# Patient Record
Sex: Male | Born: 1940 | Race: White | Hispanic: No | Marital: Married
Health system: Midwestern US, Community
[De-identification: ages and names within clinical notes are randomized; demographics above are authoritative.]

## PROBLEM LIST (undated history)

## (undated) DIAGNOSIS — M109 Gout, unspecified: Secondary | ICD-10-CM

## (undated) DIAGNOSIS — I495 Sick sinus syndrome: Secondary | ICD-10-CM

## (undated) DIAGNOSIS — I712 Thoracic aortic aneurysm, without rupture, unspecified: Secondary | ICD-10-CM

## (undated) DIAGNOSIS — M349 Systemic sclerosis, unspecified: Secondary | ICD-10-CM

## (undated) DIAGNOSIS — I959 Hypotension, unspecified: Secondary | ICD-10-CM

## (undated) DIAGNOSIS — I639 Cerebral infarction, unspecified: Secondary | ICD-10-CM

## (undated) DIAGNOSIS — M341 CR(E)ST syndrome: Secondary | ICD-10-CM

## (undated) DIAGNOSIS — N401 Enlarged prostate with lower urinary tract symptoms: Secondary | ICD-10-CM

## (undated) DIAGNOSIS — Z87442 Personal history of urinary calculi: Secondary | ICD-10-CM

## (undated) DIAGNOSIS — R809 Proteinuria, unspecified: Secondary | ICD-10-CM

## (undated) DIAGNOSIS — R350 Frequency of micturition: Secondary | ICD-10-CM

## (undated) DIAGNOSIS — N200 Calculus of kidney: Secondary | ICD-10-CM

## (undated) DIAGNOSIS — R1310 Dysphagia, unspecified: Secondary | ICD-10-CM

## (undated) DIAGNOSIS — Z950 Presence of cardiac pacemaker: Secondary | ICD-10-CM

## (undated) DIAGNOSIS — Z9981 Dependence on supplemental oxygen: Secondary | ICD-10-CM

## (undated) DIAGNOSIS — G4730 Sleep apnea, unspecified: Secondary | ICD-10-CM

## (undated) DIAGNOSIS — I700 Atherosclerosis of aorta: Secondary | ICD-10-CM

## (undated) DIAGNOSIS — M47812 Spondylosis without myelopathy or radiculopathy, cervical region: Secondary | ICD-10-CM

## (undated) DIAGNOSIS — E538 Deficiency of other specified B group vitamins: Secondary | ICD-10-CM

## (undated) DIAGNOSIS — B351 Tinea unguium: Secondary | ICD-10-CM

## (undated) DIAGNOSIS — I872 Venous insufficiency (chronic) (peripheral): Secondary | ICD-10-CM

## (undated) DIAGNOSIS — G5600 Carpal tunnel syndrome, unspecified upper limb: Secondary | ICD-10-CM

## (undated) DIAGNOSIS — J449 Chronic obstructive pulmonary disease, unspecified: Secondary | ICD-10-CM

## (undated) DIAGNOSIS — R609 Edema, unspecified: Secondary | ICD-10-CM

## (undated) DIAGNOSIS — N138 Other obstructive and reflux uropathy: Secondary | ICD-10-CM

## (undated) DIAGNOSIS — G629 Polyneuropathy, unspecified: Secondary | ICD-10-CM

## (undated) DIAGNOSIS — Z7901 Long term (current) use of anticoagulants: Secondary | ICD-10-CM

## (undated) DIAGNOSIS — R413 Other amnesia: Secondary | ICD-10-CM

## (undated) DIAGNOSIS — I951 Orthostatic hypotension: Secondary | ICD-10-CM

## (undated) DIAGNOSIS — R0600 Dyspnea, unspecified: Secondary | ICD-10-CM

## (undated) DIAGNOSIS — K5790 Diverticulosis of intestine, part unspecified, without perforation or abscess without bleeding: Secondary | ICD-10-CM

## (undated) DIAGNOSIS — I491 Atrial premature depolarization: Secondary | ICD-10-CM

## (undated) DIAGNOSIS — R7303 Prediabetes: Secondary | ICD-10-CM

## (undated) DIAGNOSIS — Q2112 Patent foramen ovale: Secondary | ICD-10-CM

## (undated) DIAGNOSIS — E785 Hyperlipidemia, unspecified: Secondary | ICD-10-CM

## (undated) DIAGNOSIS — I4510 Unspecified right bundle-branch block: Secondary | ICD-10-CM

## (undated) DIAGNOSIS — I83899 Varicose veins of unspecified lower extremities with other complications: Secondary | ICD-10-CM

## (undated) DIAGNOSIS — Z77090 Contact with and (suspected) exposure to asbestos: Secondary | ICD-10-CM

## (undated) DIAGNOSIS — L97909 Non-pressure chronic ulcer of unspecified part of unspecified lower leg with unspecified severity: Secondary | ICD-10-CM

## (undated) DIAGNOSIS — I2510 Atherosclerotic heart disease of native coronary artery without angina pectoris: Secondary | ICD-10-CM

## (undated) DIAGNOSIS — Q211 Atrial septal defect: Secondary | ICD-10-CM

## (undated) DIAGNOSIS — R972 Elevated prostate specific antigen [PSA]: Secondary | ICD-10-CM

## (undated) DIAGNOSIS — K219 Gastro-esophageal reflux disease without esophagitis: Secondary | ICD-10-CM

## (undated) DIAGNOSIS — I739 Peripheral vascular disease, unspecified: Secondary | ICD-10-CM

## (undated) DIAGNOSIS — R262 Difficulty in walking, not elsewhere classified: Secondary | ICD-10-CM

## (undated) HISTORY — PX: HEMORRHOID SURGERY: SHX153

## (undated) HISTORY — PX: ANKLE FUSION: SHX881

## (undated) HISTORY — PX: OTHER SURGICAL HISTORY: SHX169

## (undated) HISTORY — PX: INSERT / REPLACE / REMOVE PACEMAKER: SUR710

---

## 2014-09-01 DIAGNOSIS — I639 Cerebral infarction, unspecified: Secondary | ICD-10-CM

## 2014-09-01 HISTORY — DX: Cerebral infarction, unspecified: I63.9

## 2015-09-18 ENCOUNTER — Emergency Department
Admission: EM | Admit: 2015-09-18 | Discharge: 2015-09-19 | Disposition: A | Discharge status: Home / Self Care | Payer: BLUE CROSS/BLUE SHIELD | Attending: Emergency Medicine | Admitting: Emergency Medicine

## 2015-09-18 ENCOUNTER — Encounter: Payer: Self-pay | Admitting: Emergency Medicine

## 2015-09-18 DIAGNOSIS — Z7902 Long term (current) use of antithrombotics/antiplatelets: Secondary | ICD-10-CM | POA: Diagnosis not present

## 2015-09-18 DIAGNOSIS — H578 Other specified disorders of eye and adnexa: Secondary | ICD-10-CM | POA: Diagnosis present

## 2015-09-18 DIAGNOSIS — Z7982 Long term (current) use of aspirin: Secondary | ICD-10-CM | POA: Diagnosis not present

## 2015-09-18 DIAGNOSIS — Z79899 Other long term (current) drug therapy: Secondary | ICD-10-CM | POA: Diagnosis not present

## 2015-09-18 DIAGNOSIS — H5461 Unqualified visual loss, right eye, normal vision left eye: Secondary | ICD-10-CM | POA: Diagnosis not present

## 2015-09-18 DIAGNOSIS — H1131 Conjunctival hemorrhage, right eye: Secondary | ICD-10-CM | POA: Diagnosis not present

## 2015-09-18 HISTORY — DX: Chronic obstructive pulmonary disease, unspecified: J44.9

## 2015-09-18 HISTORY — DX: Systemic sclerosis, unspecified: M34.9

## 2015-09-18 NOTE — ED Notes (Signed)
Pt reports he woke up this morning and noticed redness/stickyness of the right eye.  Pt c/o of pain/irritation rated at a 1 out of 10. Pt denies drainage.  Pt's right eye is deep/bright red over entire sclera.

## 2015-09-18 NOTE — ED Notes (Signed)
Pt presents to ED with redness to his right eye. Pt states he woke up with the redness and denies any known injury. Denies pain and states he feels pressure behind the affected eye. Hx of the same but has never seen an MD for evaluation.

## 2015-09-18 NOTE — ED Notes (Signed)
Pt reports he had a temporary loss of vision (5 minutes) on Tuesday or Wednesday of this week. Pt states: "there was a line directly down the center of my vision that was gone". Pt c/o pain/stickyness of right eye beginning this morning rated at a 1 out of 10. 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

## 2015-09-19 ENCOUNTER — Emergency Department: Payer: BLUE CROSS/BLUE SHIELD

## 2015-09-19 LAB — CBC WITH DIFFERENTIAL/PLATELET
BASOS ABS: 0.1 10*3/uL (ref 0–0.1)
BASOS PCT: 1 %
EOS ABS: 0.2 10*3/uL (ref 0–0.7)
EOS PCT: 2 %
HCT: 37.9 % — ABNORMAL LOW (ref 40.0–52.0)
HEMOGLOBIN: 13.2 g/dL (ref 13.0–18.0)
LYMPHS ABS: 1.2 10*3/uL (ref 1.0–3.6)
Lymphocytes Relative: 16 %
MCH: 30 pg (ref 26.0–34.0)
MCHC: 34.8 g/dL (ref 32.0–36.0)
MCV: 86.2 fL (ref 80.0–100.0)
Monocytes Absolute: 0.6 10*3/uL (ref 0.2–1.0)
Monocytes Relative: 7 %
NEUTROS PCT: 74 %
Neutro Abs: 5.8 10*3/uL (ref 1.4–6.5)
PLATELETS: 231 10*3/uL (ref 150–440)
RBC: 4.4 MIL/uL (ref 4.40–5.90)
RDW: 14.7 % — ABNORMAL HIGH (ref 11.5–14.5)
WBC: 7.8 10*3/uL (ref 3.8–10.6)

## 2015-09-19 LAB — COMPREHENSIVE METABOLIC PANEL
ALBUMIN: 3.3 g/dL — AB (ref 3.5–5.0)
ALK PHOS: 70 U/L (ref 38–126)
ALT: 21 U/L (ref 17–63)
AST: 29 U/L (ref 15–41)
Anion gap: 6 (ref 5–15)
BUN: 16 mg/dL (ref 6–20)
CALCIUM: 8.7 mg/dL — AB (ref 8.9–10.3)
CHLORIDE: 110 mmol/L (ref 101–111)
CO2: 23 mmol/L (ref 22–32)
CREATININE: 0.73 mg/dL (ref 0.61–1.24)
GFR calc Af Amer: >60 mL/min (ref >60)
GFR calc non Af Amer: >60 mL/min (ref >60)
GLUCOSE: 100 mg/dL — AB (ref 65–99)
Potassium: 3.8 mmol/L (ref 3.5–5.1)
SODIUM: 139 mmol/L (ref 135–145)
Total Bilirubin: 0.6 mg/dL (ref 0.3–1.2)
Total Protein: 6.3 g/dL — ABNORMAL LOW (ref 6.5–8.1)

## 2015-09-19 LAB — TROPONIN I

## 2015-09-19 MED ORDER — IOHEXOL 350 MG/ML SOLN
80.0000 mL | Freq: Once | INTRAVENOUS | Status: AC | PRN
  Administered 2015-09-19: 80 mL via INTRAVENOUS

## 2015-09-19 NOTE — ED Provider Notes (Signed)
[facility] Emergency Department Provider Note  ____________________________________________  Time seen: Approximately 0017 AM  I have reviewed the triage vital signs and the nursing notes.   HISTORY  Chief Complaint Eye Problem    HPI ISAIH GENTER is a 75 y.o. male who comes into the hospital today with an eye problem. The patient reports that his eye turned red and he has blood in his right eye. He reports he noticed when he woke up this morning. The patient is driving up from [place] and his wife was concerned and wanted him to get it checked. She said she does not like the look of it and last time this occurred was about a year ago the patient had a stroke. She reports that at that time the patient had some drooping of his face but he did have this redness of his eye as well. She reports that the eye cleared up before the symptoms started. The patient also reports that he did have 2-3 days ago a line of vision loss that resolved in a few minutes. The patient has been in [place] helping his brother with some medical challenges. The patient does had no facial droop or slurred speech. He's had no weakness as well. The patient is driving to [place] which is where he lives. The patient's wife was very concerned that he may have another stroke and wanted him checked out sooner rather than later. The patient reports at this time he does not have any blurred vision. He reports that his right eye is uncomfortable but it is not painful. He rates his discomfort a 1 out of 10 in intensity.   Past Medical History  Diagnosis Date  . 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 COPD, moderate ([place])     There are no active problems to display for this patient.   No past surgical history on file.  Current Outpatient Rx  Name  Route  Sig  Dispense  Refill  . aspirin 81 MG tablet   Oral   Take by mouth daily.         Marland Kitchen atorvastatin (LIPITOR) 80 MG tablet   Oral   Take 80 mg by  mouth daily.         . clopidogrel (PLAVIX) 75 MG tablet   Oral   Take 75 mg by mouth daily.         . clotrimazole-betamethasone (LOTRISONE) cream   Topical   Apply 1 application topically 2 (two) times daily.         . finasteride (PROSCAR) 5 MG tablet   Oral   Take 5 mg by mouth daily.         . hydroxypropyl methylcellulose / hypromellose (ISOPTO TEARS / GONIOVISC) 2.5 % ophthalmic solution      1 drop.         . lactobacillus acidophilus (BACID) TABS tablet   Oral   Take 2 tablets by mouth 3 (three) times daily.         . meclizine (ANTIVERT) 25 MG tablet   Oral   Take 25 mg by mouth 3 (three) times daily as needed for dizziness.         . midodrine (PROAMATINE) 5 MG tablet   Oral   Take 5 mg by mouth 3 (three) times daily with meals.         . pantoprazole (PROTONIX) 40 MG tablet   Oral   Take 40 mg by mouth daily.         Marland Kitchen  potassium citrate (UROCIT-K) 10 MEQ (1080 MG) SR tablet   Oral   Take 10 mEq by mouth 3 (three) times daily with meals.         . vitamin B-12 (CYANOCOBALAMIN) 500 MCG tablet   Oral   Take 500 mcg by mouth daily.           Allergies Cellcept; Vioxx; and Ciprofloxacin  No family history on file.  Social History Social History  Substance Use Topics  . Smoking status: Never Smoker   . Smokeless tobacco: None  . Alcohol Use: No    Review of Systems Constitutional: No fever/chills Eyes: Eye redness, temporary visual field loss ENT: No sore throat. Cardiovascular: Denies chest pain. Respiratory: Denies shortness of breath. Gastrointestinal: No abdominal pain.  No nausea, no vomiting.  No diarrhea.  No constipation. Genitourinary: Negative for dysuria. Musculoskeletal: Negative for back pain. Skin: Negative for rash. Neurological: Negative for headaches, focal weakness or numbness.  10-point ROS otherwise negative.  ____________________________________________   PHYSICAL EXAM:  VITAL SIGNS: ED  Triage Vitals  Enc Vitals Group     BP 09/18/15 2023 131/73 mmHg     Pulse Rate 09/18/15 2023 81     Resp 09/18/15 2023 18     Temp 09/18/15 2023 97.8 F (36.6 C)     Temp Source 09/18/15 2023 Oral     SpO2 09/18/15 2023 99 %     Weight 09/18/15 2023 200 lb (90.719 kg)     Height 09/18/15 2023 5\' 8"  (1.727 m)     Head Cir --      Peak Flow --      Pain Score 09/18/15 2025 2     Pain Loc --      Pain Edu? --      Excl. in [place]? --     Constitutional: Alert and oriented. Well appearing and in no acute distress. Eyes: Supple conjunctival hemorrhage noted to right thigh, funduscopic exam shows no AV nicking no hemorrhage to retina no papilledema no pale retina Head: Atraumatic. Nose: No congestion/rhinnorhea. Mouth/Throat: Mucous membranes are moist.  Oropharynx non-erythematous. Cardiovascular: Normal rate, regular rhythm. Grossly normal heart sounds.  Good peripheral circulation. Respiratory: Normal respiratory effort.  No retractions. Lungs CTAB. Gastrointestinal: Soft and nontender. No distention. Positive Bowel sounds Musculoskeletal: No lower extremity tenderness nor edema.   Neurologic:  Normal speech and language. Cranial nerves II through XII are grossly intact with no focal motor or neuro deficits Skin:  Skin is warm, dry and intact.  Psychiatric: Mood and affect are normal.   ____________________________________________   LABS (all labs ordered are listed, but only abnormal results are displayed)  Labs Reviewed  CBC WITH DIFFERENTIAL/PLATELET - Abnormal; Notable for the following:    HCT 37.9 (*)    RDW 14.7 (*)    All other components within normal limits  COMPREHENSIVE METABOLIC PANEL - Abnormal; Notable for the following:    Glucose, Bld 100 (*)    Calcium 8.7 (*)    Total Protein 6.3 (*)    Albumin 3.3 (*)    All other components within normal limits  TROPONIN I   ____________________________________________  EKG  ED ECG REPORT I, Loney Hering,  the attending physician, personally viewed and interpreted this ECG.   Date: 09/18/2015  EKG Time: 2344  Rate: 62  Rhythm: RBBB, atrial paced rhythm  Axis: normal  Intervals:right bundle branch block  ST&T Change: none  ____________________________________________  RADIOLOGY  CT angiogram head: No acute  intracranial process, normal CTA of the head ____________________________________________   PROCEDURES  Procedure(s) performed: None  Critical Care performed: No  ____________________________________________   INITIAL IMPRESSION / ASSESSMENT AND PLAN / ED COURSE  Pertinent labs & imaging results that were available during my care of the patient were reviewed by me and considered in my medical decision making (see chart for details).  This is a 75 year old male who comes into the hospital today with some right supple conjunctival hemorrhage and a concern for stroke. The patient had similar symptoms with a stroke previously. We did check some blood work as well as an EKG we also checked a CT scan which was unremarkable. At this time I feel that the patient is stable to be discharged home. His visual acuity is unremarkable and he is in no acute distress. He will be discharged to follow-up with ophthalmology as well as his primary care physician. Wife understand the plans as stated. ____________________________________________   FINAL CLINICAL IMPRESSION(S) / ED DIAGNOSES  Final diagnoses:  Vision loss of right eye  Subconjunctival hemorrhage, right      Loney Hering, MD 09/19/15 [phone]

## 2015-09-19 NOTE — Discharge Instructions (Signed)
Subconjunctival Hemorrhage Subconjunctival hemorrhage is bleeding that happens between the white part of your eye (sclera) and the clear membrane that covers the outside of your eye (conjunctiva). There are many tiny blood vessels near the surface of your eye. A subconjunctival hemorrhage happens when one or more of these vessels breaks and bleeds, causing a red patch to appear on your eye. This is similar to a bruise. Depending on the amount of bleeding, the red patch may only cover a small area of your eye or it may cover the entire visible part of the sclera. If a lot of blood collects under the conjunctiva, there may also be swelling. Subconjunctival hemorrhages do not affect your vision or cause pain, but your eye may feel irritated if there is swelling. Subconjunctival hemorrhages usually do not require treatment, and they disappear on their own within two weeks. CAUSES This condition may be caused by:  Mild trauma, such as rubbing your eye too hard.  Severe trauma or blunt injuries.  Coughing, sneezing, or vomiting.  Straining, such as when lifting a heavy object.  High blood pressure.  Recent eye surgery.  A history of diabetes.  Certain medicines, especially blood thinners (anticoagulants).  Other conditions, such as eye tumors, bleeding disorders, or blood vessel abnormalities. Subconjunctival hemorrhages can happen without an obvious cause.  SYMPTOMS  Symptoms of this condition include:  A bright red or dark red patch on the white part of the eye.  The red area may spread out to cover a larger area of the eye before it goes away.  The red area may turn brownish-yellow before it goes away.  Swelling.  Mild eye irritation. DIAGNOSIS This condition is diagnosed with a physical exam. If your subconjunctival hemorrhage was caused by trauma, your health care provider may refer you to an eye specialist (ophthalmologist) or another specialist to check for other injuries. You  may have other tests, including:  An eye exam.  A blood pressure check.  Blood tests to check for bleeding disorders. If your subconjunctival hemorrhage was caused by trauma, X-rays or a CT scan may be done to check for other injuries. TREATMENT Usually, no treatment is needed. Your health care provider may recommend eye drops or cold compresses to help with discomfort. HOME CARE INSTRUCTIONS  Take over-the-counter and prescription medicines only as directed by your health care provider.  Use eye drops or cold compresses to help with discomfort as directed by your health care provider.  Avoid activities, things, and environments that may irritate or injure your eye.  Keep all follow-up visits as told by your health care provider. This is important. SEEK MEDICAL CARE IF:  You have pain in your eye.  The bleeding does not go away within 3 weeks.  You keep getting new subconjunctival hemorrhages. SEEK IMMEDIATE MEDICAL CARE IF:  Your vision changes or you have difficulty seeing.  You suddenly develop severe sensitivity to light.  You develop a severe headache, persistent vomiting, confusion, or abnormal tiredness (lethargy).  Your eye seems to bulge or protrude from your eye socket.  You develop unexplained bruises on your body.  You have unexplained bleeding in another area of your body.   This information is not intended to replace advice given to you by your health care provider. Make sure you discuss any questions you have with your health care provider.   Document Released: 06/19/2005 Document Revised: 03/10/2015 Document Reviewed: 08/26/2014 Elsevier Interactive Patient Education 2016 [employer].  Visual Disturbances You have had  a disturbance in your vision. This may be caused by various conditions, such as:  Migraines. Migraine headaches are often preceded by a disturbance in vision. Blind spots or light flashes are followed by a headache. This type of visual  disturbance is temporary. It does not damage the eye.  Glaucoma. This is caused by increased pressure in the eye. Symptoms include haziness, blurred vision, or seeing rainbow colored circles when looking at bright lights. Partial or complete visual loss can occur. You may or may not experience eye pain. Visual loss may be gradual or sudden and is irreversible. Glaucoma is the leading cause of blindness.  Retina problems. Vision will be reduced if the retina becomes detached or if there is a circulation problem as with diabetes, high blood pressure, or a mini-stroke. Symptoms include seeing "floaters," flashes of light, or shadows, as if a curtain has fallen over your eye.  Optic nerve problems. The main nerve in your eye can be damaged by redness, soreness, and swelling (inflammation), poor circulation, drugs, and toxins. It is very important to have a complete exam done by a specialist to determine the exact cause of your eye problem. The specialist may recommend medicines or surgery, depending on the cause of the problem. This can help prevent further loss of vision or reduce the risk of having a stroke. Contact the caregiver to whom you have been referred and arrange for follow-up care right away. SEEK IMMEDIATE MEDICAL CARE IF:   Your vision gets worse.  You develop severe headaches.  You have any weakness or numbness in the face, arms, or legs.  You have any trouble speaking or walking.   This information is not intended to replace advice given to you by your health care provider. Make sure you discuss any questions you have with your health care provider.   Document Released: 07/27/2004 Document Revised: 09/11/2011 Document Reviewed: 11/26/2013 Elsevier Interactive Patient Education [employer].

## 2015-09-19 NOTE — ED Notes (Signed)
Reviewed d/c instructions, and follow-up care with pt. Pt verbalized understanding 

## 2016-07-12 DIAGNOSIS — K219 Gastro-esophageal reflux disease without esophagitis: Secondary | ICD-10-CM | POA: Insufficient documentation

## 2016-07-12 DIAGNOSIS — N401 Enlarged prostate with lower urinary tract symptoms: Secondary | ICD-10-CM | POA: Insufficient documentation

## 2016-07-12 DIAGNOSIS — Z8673 Personal history of transient ischemic attack (TIA), and cerebral infarction without residual deficits: Secondary | ICD-10-CM | POA: Insufficient documentation

## 2016-07-12 DIAGNOSIS — J449 Chronic obstructive pulmonary disease, unspecified: Secondary | ICD-10-CM | POA: Insufficient documentation

## 2016-07-12 DIAGNOSIS — Z950 Presence of cardiac pacemaker: Secondary | ICD-10-CM | POA: Insufficient documentation

## 2016-07-26 DIAGNOSIS — I7300 Raynaud's syndrome without gangrene: Secondary | ICD-10-CM | POA: Insufficient documentation

## 2016-07-28 ENCOUNTER — Other Ambulatory Visit: Payer: Self-pay | Admitting: Internal Medicine

## 2016-07-28 DIAGNOSIS — J849 Interstitial pulmonary disease, unspecified: Secondary | ICD-10-CM

## 2016-07-28 DIAGNOSIS — M349 Systemic sclerosis, unspecified: Secondary | ICD-10-CM

## 2016-08-02 DIAGNOSIS — I6523 Occlusion and stenosis of bilateral carotid arteries: Secondary | ICD-10-CM | POA: Insufficient documentation

## 2016-08-08 ENCOUNTER — Other Ambulatory Visit: Payer: Self-pay | Admitting: Internal Medicine

## 2016-08-08 DIAGNOSIS — J849 Interstitial pulmonary disease, unspecified: Secondary | ICD-10-CM

## 2016-08-08 DIAGNOSIS — M349 Systemic sclerosis, unspecified: Secondary | ICD-10-CM

## 2016-08-09 ENCOUNTER — Ambulatory Visit
Admission: RE | Admit: 2016-08-09 | Discharge: 2016-08-09 | Disposition: A | Discharge status: Home / Self Care | Payer: Medicare HMO | Source: Ambulatory Visit | Attending: Internal Medicine | Admitting: Internal Medicine

## 2016-08-09 DIAGNOSIS — K228 Other specified diseases of esophagus: Secondary | ICD-10-CM | POA: Diagnosis not present

## 2016-08-09 DIAGNOSIS — R911 Solitary pulmonary nodule: Secondary | ICD-10-CM | POA: Diagnosis not present

## 2016-08-09 DIAGNOSIS — M349 Systemic sclerosis, unspecified: Secondary | ICD-10-CM | POA: Diagnosis not present

## 2016-08-09 DIAGNOSIS — J849 Interstitial pulmonary disease, unspecified: Secondary | ICD-10-CM | POA: Diagnosis not present

## 2016-08-09 DIAGNOSIS — I700 Atherosclerosis of aorta: Secondary | ICD-10-CM | POA: Diagnosis not present

## 2016-08-09 DIAGNOSIS — I712 Thoracic aortic aneurysm, without rupture: Secondary | ICD-10-CM | POA: Diagnosis not present

## 2016-08-10 ENCOUNTER — Ambulatory Visit: Payer: Medicare Other

## 2016-08-14 DIAGNOSIS — I7121 Aneurysm of the ascending aorta, without rupture: Secondary | ICD-10-CM | POA: Insufficient documentation

## 2016-08-14 DIAGNOSIS — I719 Aortic aneurysm of unspecified site, without rupture: Secondary | ICD-10-CM | POA: Insufficient documentation

## 2016-08-16 DIAGNOSIS — M341 CR(E)ST syndrome: Secondary | ICD-10-CM | POA: Insufficient documentation

## 2016-08-25 ENCOUNTER — Other Ambulatory Visit: Payer: Self-pay | Admitting: Gastroenterology

## 2016-08-25 DIAGNOSIS — R1310 Dysphagia, unspecified: Secondary | ICD-10-CM

## 2016-08-25 DIAGNOSIS — K219 Gastro-esophageal reflux disease without esophagitis: Secondary | ICD-10-CM

## 2016-08-30 ENCOUNTER — Ambulatory Visit: Payer: Medicare HMO | Attending: Specialist

## 2016-08-30 DIAGNOSIS — G4733 Obstructive sleep apnea (adult) (pediatric): Secondary | ICD-10-CM | POA: Insufficient documentation

## 2016-08-30 DIAGNOSIS — G4761 Periodic limb movement disorder: Secondary | ICD-10-CM | POA: Diagnosis not present

## 2016-08-31 ENCOUNTER — Ambulatory Visit
Admission: RE | Admit: 2016-08-31 | Discharge: 2016-08-31 | Disposition: A | Discharge status: Home / Self Care | Payer: Medicare HMO | Source: Ambulatory Visit | Attending: Gastroenterology | Admitting: Gastroenterology

## 2016-08-31 DIAGNOSIS — R1310 Dysphagia, unspecified: Secondary | ICD-10-CM | POA: Diagnosis present

## 2016-08-31 DIAGNOSIS — K219 Gastro-esophageal reflux disease without esophagitis: Secondary | ICD-10-CM | POA: Insufficient documentation

## 2016-09-19 ENCOUNTER — Ambulatory Visit: Payer: Medicare HMO | Attending: Specialist

## 2016-09-19 DIAGNOSIS — G4761 Periodic limb movement disorder: Secondary | ICD-10-CM | POA: Insufficient documentation

## 2016-09-19 DIAGNOSIS — G4733 Obstructive sleep apnea (adult) (pediatric): Secondary | ICD-10-CM | POA: Insufficient documentation

## 2016-11-15 ENCOUNTER — Other Ambulatory Visit: Payer: Self-pay | Admitting: Specialist

## 2016-11-15 DIAGNOSIS — R9389 Abnormal findings on diagnostic imaging of other specified body structures: Secondary | ICD-10-CM

## 2016-11-15 DIAGNOSIS — R0602 Shortness of breath: Secondary | ICD-10-CM

## 2016-12-29 ENCOUNTER — Ambulatory Visit: Payer: Medicare HMO | Admitting: Anesthesiology

## 2016-12-29 ENCOUNTER — Encounter
Admission: RE | Disposition: A | Discharge status: Home / Self Care | Payer: Self-pay | Source: Ambulatory Visit | Attending: Gastroenterology

## 2016-12-29 ENCOUNTER — Ambulatory Visit
Admission: RE | Admit: 2016-12-29 | Discharge: 2016-12-29 | Disposition: A | Discharge status: Home / Self Care | Payer: Medicare HMO | Source: Ambulatory Visit | Attending: Gastroenterology | Admitting: Gastroenterology

## 2016-12-29 ENCOUNTER — Encounter: Payer: Self-pay | Admitting: Anesthesiology

## 2016-12-29 DIAGNOSIS — Z8673 Personal history of transient ischemic attack (TIA), and cerebral infarction without residual deficits: Secondary | ICD-10-CM | POA: Insufficient documentation

## 2016-12-29 DIAGNOSIS — K5730 Diverticulosis of large intestine without perforation or abscess without bleeding: Secondary | ICD-10-CM | POA: Insufficient documentation

## 2016-12-29 DIAGNOSIS — G629 Polyneuropathy, unspecified: Secondary | ICD-10-CM | POA: Insufficient documentation

## 2016-12-29 DIAGNOSIS — Z86010 Personal history of colonic polyps: Secondary | ICD-10-CM | POA: Insufficient documentation

## 2016-12-29 DIAGNOSIS — Z7902 Long term (current) use of antithrombotics/antiplatelets: Secondary | ICD-10-CM | POA: Insufficient documentation

## 2016-12-29 DIAGNOSIS — K3189 Other diseases of stomach and duodenum: Secondary | ICD-10-CM | POA: Diagnosis not present

## 2016-12-29 DIAGNOSIS — R7303 Prediabetes: Secondary | ICD-10-CM | POA: Insufficient documentation

## 2016-12-29 DIAGNOSIS — M1990 Unspecified osteoarthritis, unspecified site: Secondary | ICD-10-CM | POA: Insufficient documentation

## 2016-12-29 DIAGNOSIS — E785 Hyperlipidemia, unspecified: Secondary | ICD-10-CM | POA: Insufficient documentation

## 2016-12-29 DIAGNOSIS — G4730 Sleep apnea, unspecified: Secondary | ICD-10-CM | POA: Diagnosis not present

## 2016-12-29 DIAGNOSIS — Z950 Presence of cardiac pacemaker: Secondary | ICD-10-CM | POA: Diagnosis not present

## 2016-12-29 DIAGNOSIS — K5520 Angiodysplasia of colon without hemorrhage: Secondary | ICD-10-CM | POA: Diagnosis not present

## 2016-12-29 DIAGNOSIS — K228 Other specified diseases of esophagus: Secondary | ICD-10-CM | POA: Insufficient documentation

## 2016-12-29 DIAGNOSIS — Z79899 Other long term (current) drug therapy: Secondary | ICD-10-CM | POA: Insufficient documentation

## 2016-12-29 DIAGNOSIS — R1310 Dysphagia, unspecified: Secondary | ICD-10-CM | POA: Insufficient documentation

## 2016-12-29 DIAGNOSIS — Z880 Allergy status to penicillin: Secondary | ICD-10-CM | POA: Diagnosis not present

## 2016-12-29 DIAGNOSIS — Z7982 Long term (current) use of aspirin: Secondary | ICD-10-CM | POA: Insufficient documentation

## 2016-12-29 HISTORY — DX: Presence of cardiac pacemaker: Z95.0

## 2016-12-29 HISTORY — DX: Tinea unguium: B35.1

## 2016-12-29 HISTORY — DX: Gout, unspecified: M10.9

## 2016-12-29 HISTORY — DX: Hyperlipidemia, unspecified: E78.5

## 2016-12-29 HISTORY — PX: COLONOSCOPY WITH PROPOFOL: SHX5780

## 2016-12-29 HISTORY — DX: Dyspnea, unspecified: R06.00

## 2016-12-29 HISTORY — DX: Proteinuria, unspecified: R80.9

## 2016-12-29 HISTORY — DX: Other amnesia: R41.3

## 2016-12-29 HISTORY — DX: Deficiency of other specified B group vitamins: E53.8

## 2016-12-29 HISTORY — DX: Elevated prostate specific antigen (PSA): R97.20

## 2016-12-29 HISTORY — DX: Gastro-esophageal reflux disease without esophagitis: K21.9

## 2016-12-29 HISTORY — PX: ESOPHAGOGASTRODUODENOSCOPY (EGD) WITH PROPOFOL: SHX5813

## 2016-12-29 HISTORY — DX: Difficulty in walking, not elsewhere classified: R26.2

## 2016-12-29 HISTORY — DX: Polyneuropathy, unspecified: G62.9

## 2016-12-29 HISTORY — DX: Sleep apnea, unspecified: G47.30

## 2016-12-29 HISTORY — DX: Benign prostatic hyperplasia with lower urinary tract symptoms: N40.1

## 2016-12-29 HISTORY — DX: Other obstructive and reflux uropathy: N13.8

## 2016-12-29 HISTORY — DX: Venous insufficiency (chronic) (peripheral): I87.2

## 2016-12-29 HISTORY — DX: Dysphagia, unspecified: R13.10

## 2016-12-29 HISTORY — DX: Varicose veins of unspecified lower extremity with other complications: I83.899

## 2016-12-29 HISTORY — DX: Diverticulosis of intestine, part unspecified, without perforation or abscess without bleeding: K57.90

## 2016-12-29 HISTORY — DX: Edema, unspecified: R60.9

## 2016-12-29 HISTORY — DX: Cerebral infarction, unspecified: I63.9

## 2016-12-29 HISTORY — DX: Hypotension, unspecified: I95.9

## 2016-12-29 HISTORY — DX: Atrial septal defect: Q21.1

## 2016-12-29 HISTORY — DX: Calculus of kidney: N20.0

## 2016-12-29 HISTORY — DX: Orthostatic hypotension: I95.1

## 2016-12-29 HISTORY — DX: Contact with and (suspected) exposure to asbestos: Z77.090

## 2016-12-29 HISTORY — DX: Prediabetes: R73.03

## 2016-12-29 HISTORY — DX: Non-pressure chronic ulcer of unspecified part of unspecified lower leg with unspecified severity: L97.909

## 2016-12-29 HISTORY — DX: Patent foramen ovale: Q21.12

## 2016-12-29 HISTORY — DX: Spondylosis without myelopathy or radiculopathy, cervical region: M47.812

## 2016-12-29 HISTORY — DX: Carpal tunnel syndrome, unspecified upper limb: G56.00

## 2016-12-29 HISTORY — DX: Systemic sclerosis, unspecified: M34.9

## 2016-12-29 HISTORY — DX: Frequency of micturition: R35.0

## 2016-12-29 SURGERY — COLONOSCOPY WITH PROPOFOL
Anesthesia: General

## 2016-12-29 MED ORDER — FENTANYL CITRATE (PF) 100 MCG/2ML IJ SOLN
INTRAMUSCULAR | Status: DC | PRN
  Administered 2016-12-29: 50 ug via INTRAVENOUS

## 2016-12-29 MED ORDER — GLYCOPYRROLATE 0.2 MG/ML IJ SOLN
INTRAMUSCULAR | Status: DC | PRN
  Administered 2016-12-29: 0.1 mg via INTRAVENOUS

## 2016-12-29 MED ORDER — PROPOFOL 500 MG/50ML IV EMUL
INTRAVENOUS | Status: DC | PRN
  Administered 2016-12-29: 120 ug/kg/min via INTRAVENOUS

## 2016-12-29 MED ORDER — FENTANYL CITRATE (PF) 100 MCG/2ML IJ SOLN
INTRAMUSCULAR | Status: AC
  Filled 2016-12-29: qty 2

## 2016-12-29 MED ORDER — MIDAZOLAM HCL 2 MG/2ML IJ SOLN
INTRAMUSCULAR | Status: DC | PRN
  Administered 2016-12-29: 2 mg via INTRAVENOUS

## 2016-12-29 MED ORDER — SODIUM CHLORIDE 0.9 % IV SOLN
INTRAVENOUS | Status: DC
  Administered 2016-12-29: 11:00:00 via INTRAVENOUS

## 2016-12-29 MED ORDER — MIDAZOLAM HCL 2 MG/2ML IJ SOLN
INTRAMUSCULAR | Status: AC
  Filled 2016-12-29: qty 2

## 2016-12-29 MED ORDER — LIDOCAINE HCL (CARDIAC) 20 MG/ML IV SOLN
INTRAVENOUS | Status: DC | PRN
  Administered 2016-12-29: 30 mg via INTRAVENOUS

## 2016-12-29 MED ORDER — PROPOFOL 500 MG/50ML IV EMUL
INTRAVENOUS | Status: AC
Start: 2016-12-29 — End: 2016-12-29
  Filled 2016-12-29: qty 50

## 2016-12-29 MED ORDER — LIDOCAINE HCL (PF) 1 % IJ SOLN
INTRAMUSCULAR | Status: AC
  Filled 2016-12-29: qty 2

## 2016-12-29 NOTE — Op Note (Signed)
[facility] Patient Name: Jay Watkins Procedure Date: 12/29/2016 10:32 AM MRN: [id number] Account #: [id number] Date of Birth: [birth year] Admit Type: Outpatient Age: 76 Room: [facility] ENDO ROOM 1 Gender: Male Note Status: Finalized Procedure:            Upper GI endoscopy Indications:          Dysphagia Providers:            Lollie Sails, MD Referring MD:         Dion Body (Referring MD) Medicines:            Monitored Anesthesia Care Complications:        No immediate complications. Procedure:            Pre-Anesthesia Assessment:                       - ASA Grade Assessment: III - A patient with severe                        systemic disease.                       After obtaining informed consent, the endoscope was                        passed under direct vision. Throughout the procedure,                        the patient's blood pressure, pulse, and oxygen                        saturations were monitored continuously. The Endoscope                        was introduced through the mouth, and advanced to the                        third part of duodenum. The upper GI endoscopy was                        accomplished without difficulty. The patient tolerated                        the procedure well. Findings:      The Z-line was variable. Biopsies were taken with a cold forceps for       histology.      There is no evidence of stenosis or stricture. There is content in the       esophagus of mucus and liquids. The walls of the esophagus appear atonic,      The entire examined stomach was normal.      Diffuse mild mucosal variance characterized by smoothness was found in       the entire duodenum. Biopsies were taken with a cold forceps for       histology.      Abnormal motility was noted in the esophagus. The cricopharyngeus was       normal. There is a decrease in motility of the esophageal body. The       distal  esophagus/lower esophageal sphincter is open. Impression:           - Z-line variable. Biopsied.                       -  Normal stomach.                       - Mucosal variant in the duodenum. Biopsied. Recommendation:       - Continue present medications.                       - Perform routine esophageal manometry at appointment                        to be scheduled.                       - Perform a colonoscopy today.                       - Await pathology results. Procedure Code(s):    --- Professional ---                       [phone], Esophagogastroduodenoscopy, flexible, transoral;                        with biopsy, single or multiple Diagnosis Code(s):    --- Professional ---                       K22.8, Other specified diseases of esophagus                       K31.89, Other diseases of stomach and duodenum                       R13.10, Dysphagia, unspecified CPT copyright 2016 American Medical Association. All rights reserved. The codes documented in this report are preliminary and upon coder review may  be revised to meet current compliance requirements. Lollie Sails, MD 12/29/2016 12:23:16 PM This report has been signed electronically. Number of Addenda: 0 Note Initiated On: 12/29/2016 10:32 AM      [facility]

## 2016-12-29 NOTE — Anesthesia Postprocedure Evaluation (Signed)
Anesthesia Post Note  Patient: Jay Watkins  Procedure(s) Performed: Procedure(s) (LRB): COLONOSCOPY WITH PROPOFOL (N/A) ESOPHAGOGASTRODUODENOSCOPY (EGD) WITH PROPOFOL (N/A)  Patient location during evaluation: Endoscopy Anesthesia Type: General Level of consciousness: awake and alert Pain management: pain level controlled Vital Signs Assessment: post-procedure vital signs reviewed and stable Respiratory status: spontaneous breathing, nonlabored ventilation, respiratory function stable and patient connected to nasal cannula oxygen Cardiovascular status: blood pressure returned to baseline and stable Postop Assessment: no signs of nausea or vomiting Anesthetic complications: no     Last Vitals:  Vitals:   12/29/16 1250 12/29/16 1300  BP: (!) 99/57 (!) 100/58  Pulse: 64 61  Resp: 17 14  Temp:      Last Pain:  Vitals:   12/29/16 1230  TempSrc: Tympanic                 Desira Alessandrini S

## 2016-12-29 NOTE — Anesthesia Procedure Notes (Signed)
Performed by: COOK-MARTIN, Laruen Risser Pre-anesthesia Checklist: Patient identified, Emergency Drugs available, Suction available, Patient being monitored and Timeout performed Patient Re-evaluated:Patient Re-evaluated prior to inductionOxygen Delivery Method: Nasal cannula Preoxygenation: Pre-oxygenation with 100% oxygen Intubation Type: IV induction Airway Equipment and Method: Bite block Placement Confirmation: positive ETCO2 and CO2 detector     

## 2016-12-29 NOTE — Anesthesia Preprocedure Evaluation (Signed)
Anesthesia Evaluation  Patient identified by MRN, date of birth, ID band Patient awake    Reviewed: Allergy & Precautions, NPO status , Patient's Chart, lab work & pertinent test results, reviewed documented beta blocker date and time   Airway Mallampati: III  TM Distance: >3 FB     Dental  (+) Chipped   Pulmonary shortness of breath, sleep apnea , COPD,           Cardiovascular + pacemaker      Neuro/Psych  Neuromuscular disease CVA    GI/Hepatic   Endo/Other    Renal/GU Renal disease     Musculoskeletal  (+) Arthritis ,   Abdominal   Peds  Hematology   Anesthesia Other Findings   Reproductive/Obstetrics                             Anesthesia Physical Anesthesia Plan  ASA: III  Anesthesia Plan: General   Post-op Pain Management:    Induction: Intravenous  PONV Risk Score and Plan:   Airway Management Planned:   Additional Equipment:   Intra-op Plan:   Post-operative Plan:   Informed Consent: I have reviewed the patients History and Physical, chart, labs and discussed the procedure including the risks, benefits and alternatives for the proposed anesthesia with the patient or authorized representative who has indicated his/her understanding and acceptance.     Plan Discussed with: CRNA  Anesthesia Plan Comments:         Anesthesia Quick Evaluation

## 2016-12-29 NOTE — Anesthesia Post-op Follow-up Note (Cosign Needed)
Anesthesia QCDR form completed.        

## 2016-12-29 NOTE — Transfer of Care (Signed)
Immediate Anesthesia Transfer of Care Note  Patient: Jay Watkins  Procedure(s) Performed: Procedure(s): COLONOSCOPY WITH PROPOFOL (N/A) ESOPHAGOGASTRODUODENOSCOPY (EGD) WITH PROPOFOL (N/A)  Patient Location: PACU  Anesthesia Type:General  Level of Consciousness: awake and sedated  Airway & Oxygen Therapy: Patient Spontanous Breathing and Patient connected to nasal cannula oxygen  Post-op Assessment: Report given to RN and Post -op Vital signs reviewed and stable  Post vital signs: Reviewed and stable  Last Vitals:  Vitals:   12/29/16 0957  BP: 128/75  Pulse: 66  Resp: 16  Temp: 36.6 C    Last Pain:  Vitals:   12/29/16 0957  TempSrc: Tympanic         Complications: No apparent anesthesia complications

## 2016-12-29 NOTE — H&P (Signed)
Outpatient short stay form Pre-procedure 12/29/2016 10:39 AM Lollie Sails MD  Primary Physician: Dr Dion Body  Reason for visit:  EGD and colonoscopy  History of present illness:  Patient is a 76 year old male presenting today as above. 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 He had a barium swallow on 08/31/2016 showing significant dilation of the mid and distal esophagus without significant muscular function. It is of note that there is a long narrowing noted at the bottom of the esophagus clinically this appears to be possible achalasia. He has had routine dilations on several occasions in the past but apparently has not had a evaluation in regards to achalasia.    Current Facility-Administered Medications:  .  0.9 %  sodium chloride infusion, , Intravenous, Continuous, Lollie Sails, MD .  lidocaine (PF) (XYLOCAINE) 1 % injection, , , ,   Prescriptions Prior to Admission  Medication Sig Dispense Refill Last Dose  . umeclidinium bromide (INCRUSE ELLIPTA) 62.5 MCG/INH AEPB Inhale 1 puff into the lungs daily.   12/29/2016 at 0700  . aspirin 81 MG tablet Take by mouth daily.   Not Taking at Unknown time  . atorvastatin (LIPITOR) 80 MG tablet Take 80 mg by mouth daily.     . clopidogrel (PLAVIX) 75 MG tablet Take 75 mg by mouth daily.   12/22/2016 at 0800  . clotrimazole-betamethasone (LOTRISONE) cream Apply 1 application topically 2 (two) times daily.     . finasteride (PROSCAR) 5 MG tablet Take 5 mg by mouth daily.     . hydroxypropyl methylcellulose / hypromellose (ISOPTO TEARS / GONIOVISC) 2.5 % ophthalmic solution 1 drop.     . lactobacillus acidophilus (BACID) TABS tablet Take 2 tablets by mouth 3 (three) times daily.     . meclizine (ANTIVERT) 25 MG tablet Take 25 mg by mouth 3 (three) times daily as needed for dizziness.     . midodrine (PROAMATINE) 5 MG tablet Take 5 mg by mouth 3 (three) times daily with meals.     . pantoprazole  (PROTONIX) 40 MG tablet Take 40 mg by mouth daily.     . potassium citrate (UROCIT-K) 10 MEQ (1080 MG) SR tablet Take 10 mEq by mouth 3 (three) times daily with meals.     . vitamin B-12 (CYANOCOBALAMIN) 500 MCG tablet Take 500 mcg by mouth daily.        Allergies  Allergen Reactions  . Cellcept [Mycophenolate] Swelling  . Vioxx [Rofecoxib]   . Ciprofloxacin      Past Medical History:  Diagnosis Date  . Asbestos exposure   . B12 deficiency   . BPH with urinary obstruction    lower urinary tract symptoms  . Carpal tunnel syndrome   . COPD, moderate ([place])   . Difficulty walking   . Diverticulosis   . Dysphagia   . Dyspnea    unspecified  . Edema   . Elevated PSA   . GERD (gastroesophageal reflux disease)   . Gouty arthropathy   . Hyperlipidemia   . Hypotension   . Memory loss   . Neuropathy    idopathic peripheral  . Onychomycosis   . Orthostatic hypotension   . Patent foramen ovale   . Pre-diabetes   . Presence of permanent cardiac pacemaker   . Proteinuria   . Renal calculus   . 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 Scleroderma ([place])   . Sleep apnea   . Spondylosis of cervical spine   . Stasis  dermatitis   . Stroke ([place])   . 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 Urinary frequency   . Varicose veins of leg with edema     Review of systems:      Physical Exam    Heart and lungs: Regular rate and rhythm without rub or gallop, lungs are bilaterally clear.    HEENT: Normocephalic atraumatic eyes are anicteric    Other:     Pertinant exam for procedure: Soft nontender nondistended bowel sounds positive normoactive.    Planned proceedures: EGD, colonoscopy and indicated procedures.  It is of note patient takes Plavix and has held that the past 6 days. He takes no aspirin or blood thinning agents otherwise.    Lollie Sails, MD Gastroenterology 12/29/2016  10:39 AM

## 2016-12-29 NOTE — Op Note (Signed)
[facility] Gastroenterology Patient Name: Jay Watkins Procedure Date: 12/29/2016 10:30 AM MRN: [id number] Account #: [id number] Date of Birth: [birth year] Admit Type: Outpatient Age: 76 Room: [facility] ENDO ROOM 1 Gender: Male Note Status: Finalized Procedure:            Colonoscopy Indications:          Personal history of colonic polyps Providers:            Lollie Sails, MD Referring MD:         Dion Body (Referring MD) Complications:        No immediate complications. Procedure:            Pre-Anesthesia Assessment:                       - ASA Grade Assessment: III - A patient with severe                        systemic disease.                       After obtaining informed consent, the colonoscope was                        passed under direct vision. Throughout the procedure,                        the patient's blood pressure, pulse, and oxygen                        saturations were monitored continuously. The                        Colonoscope was introduced through the anus with the                        intention of advancing to the cecum. The scope was                        advanced to the sigmoid colon before the procedure was                        aborted. Medications were given. The colonoscopy was                        unusually difficult due to poor bowel prep with stool                        present. Findings:      Multiple small-mouthed diverticula were found in the sigmoid colon.      Multiple small angioectasias without bleeding were found in the sigmoid       colon.      A moderate amount of semi-liquid stool was found in the rectum and in       the sigmoid colon, precluding visualization. Impression:           - Diverticulosis in the sigmoid colon.                       - Multiple non-bleeding colonic angioectasias.                       -  Stool in the rectum and in the sigmoid colon.                       - No specimens  collected. Recommendation:       - Discharge patient to home.                       - reschedule and reprep. Procedure Code(s):    --- Professional ---                       [phone], 43, Colonoscopy, flexible; diagnostic, including                        collection of specimen(s) by brushing or washing, when                        performed (separate procedure) Diagnosis Code(s):    --- Professional ---                       L24.40, Angiodysplasia of colon without hemorrhage                       Z86.010, Personal history of colonic polyps                       K57.30, Diverticulosis of large intestine without                        perforation or abscess without bleeding CPT copyright 2016 American [employer] Association. All rights reserved. The codes documented in this report are preliminary and upon coder review may  be revised to meet current compliance requirements. Lollie Sails, MD 12/29/2016 12:34:06 PM This report has been signed electronically. Number of Addenda: 0 Note Initiated On: 12/29/2016 10:30 AM Total Procedure Duration: 0 hours 5 minutes 6 seconds       [facility]

## 2017-01-01 ENCOUNTER — Encounter: Payer: Self-pay | Admitting: Gastroenterology

## 2017-01-02 LAB — SURGICAL PATHOLOGY

## 2017-01-15 ENCOUNTER — Encounter: Payer: Self-pay | Admitting: *Deleted

## 2017-01-16 ENCOUNTER — Encounter: Payer: Self-pay | Admitting: *Deleted

## 2017-01-16 ENCOUNTER — Ambulatory Visit
Admission: RE | Admit: 2017-01-16 | Discharge: 2017-01-16 | Disposition: A | Discharge status: Home / Self Care | Payer: Medicare HMO | Source: Ambulatory Visit | Attending: Gastroenterology | Admitting: Gastroenterology

## 2017-01-16 ENCOUNTER — Ambulatory Visit: Payer: Medicare HMO | Admitting: Anesthesiology

## 2017-01-16 ENCOUNTER — Encounter
Admission: RE | Disposition: A | Discharge status: Home / Self Care | Payer: Self-pay | Source: Ambulatory Visit | Attending: Gastroenterology

## 2017-01-16 DIAGNOSIS — Z1211 Encounter for screening for malignant neoplasm of colon: Secondary | ICD-10-CM | POA: Diagnosis present

## 2017-01-16 DIAGNOSIS — I739 Peripheral vascular disease, unspecified: Secondary | ICD-10-CM | POA: Insufficient documentation

## 2017-01-16 DIAGNOSIS — E785 Hyperlipidemia, unspecified: Secondary | ICD-10-CM | POA: Insufficient documentation

## 2017-01-16 DIAGNOSIS — Z950 Presence of cardiac pacemaker: Secondary | ICD-10-CM | POA: Insufficient documentation

## 2017-01-16 DIAGNOSIS — Z79899 Other long term (current) drug therapy: Secondary | ICD-10-CM | POA: Insufficient documentation

## 2017-01-16 DIAGNOSIS — Z7982 Long term (current) use of aspirin: Secondary | ICD-10-CM | POA: Diagnosis not present

## 2017-01-16 DIAGNOSIS — Z888 Allergy status to other drugs, medicaments and biological substances status: Secondary | ICD-10-CM | POA: Diagnosis not present

## 2017-01-16 DIAGNOSIS — M109 Gout, unspecified: Secondary | ICD-10-CM | POA: Insufficient documentation

## 2017-01-16 DIAGNOSIS — Z86010 Personal history of colonic polyps: Secondary | ICD-10-CM | POA: Insufficient documentation

## 2017-01-16 DIAGNOSIS — Z87442 Personal history of urinary calculi: Secondary | ICD-10-CM | POA: Diagnosis not present

## 2017-01-16 DIAGNOSIS — K219 Gastro-esophageal reflux disease without esophagitis: Secondary | ICD-10-CM | POA: Diagnosis not present

## 2017-01-16 DIAGNOSIS — R262 Difficulty in walking, not elsewhere classified: Secondary | ICD-10-CM | POA: Insufficient documentation

## 2017-01-16 DIAGNOSIS — Z8673 Personal history of transient ischemic attack (TIA), and cerebral infarction without residual deficits: Secondary | ICD-10-CM | POA: Insufficient documentation

## 2017-01-16 DIAGNOSIS — R7303 Prediabetes: Secondary | ICD-10-CM | POA: Diagnosis not present

## 2017-01-16 DIAGNOSIS — Z881 Allergy status to other antibiotic agents status: Secondary | ICD-10-CM | POA: Insufficient documentation

## 2017-01-16 DIAGNOSIS — Z7902 Long term (current) use of antithrombotics/antiplatelets: Secondary | ICD-10-CM | POA: Insufficient documentation

## 2017-01-16 DIAGNOSIS — J449 Chronic obstructive pulmonary disease, unspecified: Secondary | ICD-10-CM | POA: Diagnosis not present

## 2017-01-16 DIAGNOSIS — K635 Polyp of colon: Secondary | ICD-10-CM | POA: Diagnosis not present

## 2017-01-16 DIAGNOSIS — Z6834 Body mass index (BMI) 34.0-34.9, adult: Secondary | ICD-10-CM | POA: Insufficient documentation

## 2017-01-16 DIAGNOSIS — E538 Deficiency of other specified B group vitamins: Secondary | ICD-10-CM | POA: Insufficient documentation

## 2017-01-16 DIAGNOSIS — D120 Benign neoplasm of cecum: Secondary | ICD-10-CM | POA: Insufficient documentation

## 2017-01-16 DIAGNOSIS — D123 Benign neoplasm of transverse colon: Secondary | ICD-10-CM | POA: Diagnosis not present

## 2017-01-16 HISTORY — PX: COLONOSCOPY WITH PROPOFOL: SHX5780

## 2017-01-16 SURGERY — COLONOSCOPY WITH PROPOFOL
Anesthesia: General

## 2017-01-16 MED ORDER — LIDOCAINE HCL (CARDIAC) 20 MG/ML IV SOLN
INTRAVENOUS | Status: DC | PRN
  Administered 2017-01-16: 60 mg via INTRAVENOUS

## 2017-01-16 MED ORDER — PROPOFOL 500 MG/50ML IV EMUL
INTRAVENOUS | Status: AC
  Filled 2017-01-16: qty 50

## 2017-01-16 MED ORDER — PROPOFOL 10 MG/ML IV BOLUS
INTRAVENOUS | Status: AC
  Filled 2017-01-16: qty 20

## 2017-01-16 MED ORDER — PHENYLEPHRINE HCL 10 MG/ML IJ SOLN
INTRAMUSCULAR | Status: DC | PRN
  Administered 2017-01-16: 50 ug via INTRAVENOUS
  Administered 2017-01-16: 100 ug via INTRAVENOUS
  Administered 2017-01-16: 150 ug via INTRAVENOUS
  Administered 2017-01-16: 100 ug via INTRAVENOUS

## 2017-01-16 MED ORDER — PROPOFOL 10 MG/ML IV BOLUS
INTRAVENOUS | Status: DC | PRN
  Administered 2017-01-16: 50 mg via INTRAVENOUS
  Administered 2017-01-16: 10 mg via INTRAVENOUS
  Administered 2017-01-16: 20 mg via INTRAVENOUS

## 2017-01-16 MED ORDER — LIDOCAINE HCL (PF) 2 % IJ SOLN
INTRAMUSCULAR | Status: AC
  Filled 2017-01-16: qty 2

## 2017-01-16 MED ORDER — SODIUM CHLORIDE 0.9 % IV SOLN
INTRAVENOUS | Status: DC
  Administered 2017-01-16: 1000 mL via INTRAVENOUS
  Administered 2017-01-16: 14:00:00 via INTRAVENOUS

## 2017-01-16 MED ORDER — PROPOFOL 500 MG/50ML IV EMUL
INTRAVENOUS | Status: DC | PRN
  Administered 2017-01-16: 120 ug/kg/min via INTRAVENOUS

## 2017-01-16 NOTE — Op Note (Addendum)
[facility] Gastroenterology Patient Name: Jay Watkins Procedure Date: 01/16/2017 1:35 PM MRN: [id number] Account #: [id number] Date of Birth: 1940-07-21 Admit Type: Outpatient Age: 76 Room: [facility] ENDO ROOM 3 Gender: Male Note Status: Finalized Procedure:            Colonoscopy Indications:          Personal history of colonic polyps Providers:            Lollie Sails, MD Referring MD:         Dion Body (Referring MD) Medicines:            Monitored Anesthesia Care Complications:        No immediate complications. Procedure:            Pre-Anesthesia Assessment:                       - ASA Grade Assessment: III - A patient with severe                        systemic disease.                       After obtaining informed consent, the colonoscope was                        passed under direct vision. Throughout the procedure,                        the patient's blood pressure, pulse, and oxygen                        saturations were monitored continuously. The                        Colonoscope was introduced through the anus and                        advanced to the the cecum, identified by appendiceal                        orifice and ileocecal valve. The colonoscopy was                        performed with moderate difficulty due to significant                        looping and a tortuous colon. Successful completion of                        the procedure was aided by changing the patient to a                        supine position, changing the patient to a prone                        position and using manual pressure. The patient                        tolerated the procedure well. Findings:      Five sessile polyps were found in the cecum. The polyps were  1 to 2 mm       in size. These polyps were removed with a cold biopsy forceps. Resection       and retrieval were complete.      A 14 mm polyp was found in the hepatic flexure. The  polyp was sessile.       The polyp was removed with a cold biopsy forceps. The polyp was removed       with a lift and cut technique using a cold snare. The polyp was removed       with a piecemeal technique using a cold snare. Resection and retrieval       were complete. To prevent bleeding after the polypectomy, two hemostatic       clips were successfully placed (MR conditional). There was no bleeding       at the end of the maneuver.      The digital rectal exam was normal.      Multiple small and large-mouthed diverticula were found in the sigmoid       colon, descending colon, transverse colon and ascending colon.      Non-bleeding internal hemorrhoids were found during retroflexion. The       hemorrhoids were small.      A 3 mm polyp was found in the hepatic flexure. The polyp was sessile.       The polyp was removed with a cold biopsy forceps. Resection and       retrieval were complete. Impression:           - Five 1 to 2 mm polyps in the cecum, removed with a                        cold biopsy forceps. Resected and retrieved.                       - One 14 mm polyp at the hepatic flexure, removed using                        lift and cut and a cold snare, removed piecemeal using                        a cold snare and removed with a cold biopsy forceps.                        Resected and retrieved. Clips (MR conditional) were                        placed. Recommendation:       - Full liquid diet today, then advance as tolerated to                        low residue diet for 2 days. Procedure Code(s):    --- Professional ---                       [phone], Colonoscopy, flexible; with removal of tumor(s),                        polyp(s), or other lesion(s) by snare technique  [id number], 76, Colonoscopy, flexible; with biopsy, single                        or multiple Diagnosis Code(s):    --- Professional ---                       D12.0, Benign neoplasm of  cecum                       D12.3, Benign neoplasm of transverse colon (hepatic                        flexure or splenic flexure)                       Z86.010, Personal history of colonic polyps CPT copyright 2016 American Medical Association. All rights reserved. The codes documented in this report are preliminary and upon coder review may  be revised to meet current compliance requirements. Lollie Sails, MD 01/16/2017 2:42:02 PM This report has been signed electronically. Number of Addenda: 0 Note Initiated On: 01/16/2017 1:35 PM Scope Withdrawal Time: 0 hours 35 minutes 17 seconds  Total Procedure Duration: 0 hours 48 minutes 24 seconds       [facility]

## 2017-01-16 NOTE — Anesthesia Preprocedure Evaluation (Signed)
Anesthesia Evaluation  Patient identified by MRN, date of birth, ID band Patient awake    Reviewed: Allergy & Precautions  Airway Mallampati: III       Dental  (+) Teeth Intact   Pulmonary sleep apnea , COPD,     + decreased breath sounds      Cardiovascular Exercise Tolerance: Good + Peripheral Vascular Disease  + pacemaker  Rhythm:Regular     Neuro/Psych TIACVA    GI/Hepatic Neg liver ROS, GERD  Medicated,  Endo/Other  negative endocrine ROSMorbid obesity  Renal/GU      Musculoskeletal   Abdominal (+) + obese,   Peds negative pediatric ROS (+)  Hematology negative hematology ROS (+)   Anesthesia Other Findings   Reproductive/Obstetrics                             Anesthesia Physical Anesthesia Plan  ASA: III  Anesthesia Plan: General   Post-op Pain Management:    Induction: Intravenous  PONV Risk Score and Plan: 0  Airway Management Planned: Natural Airway and Nasal Cannula  Additional Equipment:   Intra-op Plan:   Post-operative Plan:   Informed Consent: I have reviewed the patients History and Physical, chart, labs and discussed the procedure including the risks, benefits and alternatives for the proposed anesthesia with the patient or authorized representative who has indicated his/her understanding and acceptance.     Plan Discussed with: CRNA  Anesthesia Plan Comments:         Anesthesia Quick Evaluation

## 2017-01-16 NOTE — Anesthesia Post-op Follow-up Note (Cosign Needed)
Anesthesia QCDR form completed.        

## 2017-01-16 NOTE — Anesthesia Postprocedure Evaluation (Signed)
Anesthesia Post Note  Patient: Jay Watkins  Procedure(s) Performed: Procedure(s) (LRB): COLONOSCOPY WITH PROPOFOL (N/A)  Patient location during evaluation: PACU Anesthesia Type: General Level of consciousness: awake Pain management: pain level controlled Vital Signs Assessment: post-procedure vital signs reviewed and stable Respiratory status: nonlabored ventilation Cardiovascular status: stable Anesthetic complications: no     Last Vitals:  Vitals:   01/16/17 1238 01/16/17 1441  BP: 132/75 (!) 119/51  Pulse: 79 61  Resp: 16   Temp: (!) 36.2 C (!) 36.3 C    Last Pain:  Vitals:   01/16/17 1441  TempSrc: Tympanic                 VAN STAVEREN,Anah Billard

## 2017-01-16 NOTE — H&P (Signed)
Outpatient short stay form Pre-procedure 01/16/2017 1:34 PM Lollie Sails MD  Primary Physician: Dr Dion Body  Reason for visit:  Colonoscopy  History of present illness:  Patient is a 76 year old male presenting today as above. He has a personal history of adenomatous colon polyps. About 2 or 3 weeks ago we had him in for his colonoscopy at the time of his EGD however his prep was inadequate and required repeat preparation. He is held his Plavix for over a week. He takes no aspirin with the exception of 81 mg aspirin that he has held. or blood thinning agent.    Current Facility-Administered Medications:  .  0.9 %  sodium chloride infusion, , Intravenous, Continuous, Lollie Sails, MD, Last Rate: 20 mL/hr at 01/16/17 1254, 1,000 mL at 01/16/17 1254  Prescriptions Prior to Admission  Medication Sig Dispense Refill Last Dose  . aspirin 81 MG tablet Take by mouth daily.   Not Taking at Unknown time  . atorvastatin (LIPITOR) 80 MG tablet Take 80 mg by mouth daily.     . clopidogrel (PLAVIX) 75 MG tablet Take 75 mg by mouth daily.   12/22/2016 at 0800  . clotrimazole-betamethasone (LOTRISONE) cream Apply 1 application topically 2 (two) times daily.     . finasteride (PROSCAR) 5 MG tablet Take 5 mg by mouth daily.     . hydroxypropyl methylcellulose / hypromellose (ISOPTO TEARS / GONIOVISC) 2.5 % ophthalmic solution 1 drop.     . lactobacillus acidophilus (BACID) TABS tablet Take 2 tablets by mouth 3 (three) times daily.     . meclizine (ANTIVERT) 25 MG tablet Take 25 mg by mouth 3 (three) times daily as needed for dizziness.     . midodrine (PROAMATINE) 5 MG tablet Take 5 mg by mouth 3 (three) times daily with meals.     . pantoprazole (PROTONIX) 40 MG tablet Take 40 mg by mouth daily.     . potassium citrate (UROCIT-K) 10 MEQ (1080 MG) SR tablet Take 10 mEq by mouth 3 (three) times daily with meals.     Marland Kitchen umeclidinium bromide (INCRUSE ELLIPTA) 62.5 MCG/INH AEPB Inhale 1 puff  into the lungs daily.   12/29/2016 at 0700  . vitamin B-12 (CYANOCOBALAMIN) 500 MCG tablet Take 500 mcg by mouth daily.        Allergies  Allergen Reactions  . Cellcept [Mycophenolate] Swelling  . Vioxx [Rofecoxib]   . Ciprofloxacin      Past Medical History:  Diagnosis Date  . Asbestos exposure   . B12 deficiency   . BPH with urinary obstruction    lower urinary tract symptoms  . Carpal tunnel syndrome   . COPD, moderate ([place])   . Difficulty walking   . Diverticulosis   . Dysphagia   . Dyspnea    unspecified  . Edema   . Elevated PSA   . GERD (gastroesophageal reflux disease)   . Gouty arthropathy   . Hyperlipidemia   . Hypotension   . Memory loss   . Neuropathy    idopathic peripheral  . Onychomycosis   . Orthostatic hypotension   . Patent foramen ovale   . Pre-diabetes   . Presence of permanent cardiac pacemaker   . Proteinuria   . Renal calculus   . 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 Scleroderma ([place])   . Sleep apnea   . Spondylosis of cervical spine   . Stasis dermatitis   . Stroke ([place])   . 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 Urinary frequency   . Varicose veins of leg with edema     Review of systems:      Physical Exam    Heart and lungs: Regular rate and rhythm without rub or gallop, lungs are bilaterally clear.    HEENT: Normocephalic atraumatic eyes are anicteric    Other:     Pertinant exam for procedure: Soft nontender nondistended bowel sounds positive normoactive.    Planned proceedures: Colonoscopy and indicated procedures. I have discussed the risks benefits and complications of procedures to include not limited to bleeding, infection, perforation and the risk of sedation and the patient wishes to proceed.    Lollie Sails, MD Gastroenterology 01/16/2017  1:34 PM

## 2017-01-16 NOTE — Transfer of Care (Signed)
Immediate Anesthesia Transfer of Care Note  Patient: Jay Watkins  Procedure(s) Performed: Procedure(s): COLONOSCOPY WITH PROPOFOL (N/A)  Patient Location: PACU  Anesthesia Type:General  Level of Consciousness: sedated  Airway & Oxygen Therapy: Patient Spontanous Breathing and Patient connected to nasal cannula oxygen  Post-op Assessment: Report given to RN and Post -op Vital signs reviewed and stable  Post vital signs: Reviewed and stable  Last Vitals:  Vitals:   01/16/17 1238 01/16/17 1441  BP: 132/75 (!) 119/51  Pulse: 79 61  Resp: 16 16  Temp: (!) 36.2 C (!) 36.3 C    Last Pain:  Vitals:   01/16/17 1441  TempSrc: Tympanic         Complications: No apparent anesthesia complications

## 2017-01-17 ENCOUNTER — Encounter: Payer: Self-pay | Admitting: Gastroenterology

## 2017-01-18 LAB — SURGICAL PATHOLOGY

## 2017-02-07 ENCOUNTER — Emergency Department
Admission: EM | Admit: 2017-02-07 | Discharge: 2017-02-07 | Disposition: A | Discharge status: Home / Self Care | Payer: Medicare HMO | Attending: Emergency Medicine | Admitting: Emergency Medicine

## 2017-02-07 ENCOUNTER — Emergency Department: Payer: Medicare HMO

## 2017-02-07 DIAGNOSIS — Z7982 Long term (current) use of aspirin: Secondary | ICD-10-CM | POA: Insufficient documentation

## 2017-02-07 DIAGNOSIS — J449 Chronic obstructive pulmonary disease, unspecified: Secondary | ICD-10-CM | POA: Insufficient documentation

## 2017-02-07 DIAGNOSIS — Z79899 Other long term (current) drug therapy: Secondary | ICD-10-CM | POA: Insufficient documentation

## 2017-02-07 DIAGNOSIS — R55 Syncope and collapse: Secondary | ICD-10-CM

## 2017-02-07 DIAGNOSIS — Z950 Presence of cardiac pacemaker: Secondary | ICD-10-CM | POA: Diagnosis not present

## 2017-02-07 DIAGNOSIS — M349 Systemic sclerosis, unspecified: Secondary | ICD-10-CM | POA: Insufficient documentation

## 2017-02-07 LAB — CBC
HCT: 44.8 % (ref 40.0–52.0)
Hemoglobin: 15.4 g/dL (ref 13.0–18.0)
MCH: 29.9 pg (ref 26.0–34.0)
MCHC: 34.5 g/dL (ref 32.0–36.0)
MCV: 86.8 fL (ref 80.0–100.0)
PLATELETS: 319 10*3/uL (ref 150–440)
RBC: 5.15 MIL/uL (ref 4.40–5.90)
RDW: 15.2 % — ABNORMAL HIGH (ref 11.5–14.5)
WBC: 10.9 10*3/uL — AB (ref 3.8–10.6)

## 2017-02-07 LAB — URINALYSIS, COMPLETE (UACMP) WITH MICROSCOPIC
Bacteria, UA: NONE SEEN
Bilirubin Urine: NEGATIVE
GLUCOSE, UA: NEGATIVE mg/dL
HGB URINE DIPSTICK: NEGATIVE
Ketones, ur: NEGATIVE mg/dL
Leukocytes, UA: NEGATIVE
NITRITE: NEGATIVE
PH: 5 (ref 5.0–8.0)
Protein, ur: 30 mg/dL — AB
SPECIFIC GRAVITY, URINE: 1.023 (ref 1.005–1.030)
Squamous Epithelial / LPF: NONE SEEN

## 2017-02-07 LAB — BASIC METABOLIC PANEL
Anion gap: 9 (ref 5–15)
BUN: 22 mg/dL — AB (ref 6–20)
CHLORIDE: 106 mmol/L (ref 101–111)
CO2: 24 mmol/L (ref 22–32)
Calcium: 9.8 mg/dL (ref 8.9–10.3)
Creatinine, Ser: 1.2 mg/dL (ref 0.61–1.24)
GFR calc Af Amer: >60 mL/min (ref >60)
GFR calc non Af Amer: 58 mL/min — ABNORMAL LOW (ref >60)
Glucose, Bld: 117 mg/dL — ABNORMAL HIGH (ref 65–99)
POTASSIUM: 4.2 mmol/L (ref 3.5–5.1)
SODIUM: 139 mmol/L (ref 135–145)

## 2017-02-07 LAB — TROPONIN I

## 2017-02-07 LAB — GLUCOSE, CAPILLARY: Glucose-Capillary: 96 mg/dL (ref 65–99)

## 2017-02-07 NOTE — ED Notes (Signed)
Pt reports passing out today.   No chest pain or sob. Pt has a pacemaker and reports having esophageal  issues .   No n/v/d. Pt alert.  Skin warm and dry.  Family at bedside.

## 2017-02-07 NOTE — ED Notes (Addendum)
Pacemaker interrogated with [employer].  Pt tolerated well. Family with pt.  Pt alert.  Report faxed and given to dr Reita Cliche.

## 2017-02-07 NOTE — ED Notes (Signed)
Pt alert.  No chest pain or sob.

## 2017-02-07 NOTE — ED Triage Notes (Signed)
[place] was [place] the park today and was not feeling well and told his wife he was going to sit in the car to cool  Off. Wife she noticed he was staggering and ran up just in time to catch him as he was passing out. Pt denies any pain or other sx on arrival..

## 2017-02-07 NOTE — ED Notes (Signed)
Pt finished po contrast  Ct aware.

## 2017-02-07 NOTE — ED Provider Notes (Signed)
[facility] Emergency Department Provider Note ____________________________________________   I have reviewed the triage vital signs and the triage nursing note.  HISTORY  Chief Complaint Loss of Consciousness   Historian Patient  HPI Jay Watkins is a 76 y.o. male with a history of ray nonsense scleroderma, as well as pacemaker, presents due to syncope today. Around noon today [place] the [place], was [place] out, and he started to feel weak all over. He was having a fair amount of burping and belching. He was having some nausea and midepigastric fullness. As he was trying to walk to his car his legs felt very weak both sides and he was moved to a bench where his eyes rolled back. There is no trauma. Sounds like patient took about 5 minutes to really fully come back to alertness. No reported chest pain, although he was having the chest/epigastric fullness and nausea as well as diaphoresis.  No headache or confusion or altered mental status or focal weakness or numbness. He has a history of prior stroke/TIA which gave him right-sided facial droop. No reported facial droop today.  No reported cough or trouble breathing or productive sputum. No dysuria.  Since patient's been waiting in the ED, he has been asymptomatic.  States he's never had a cardiac catheter, or history of MI, but was told he might have coronary artery disease. He is on Plavix.  He has a history of esophageal dysfunction for which she is being referred to see GI at [facility] due to scleroderma. He's had severe indigestion and burping and belching. He's had several episodes of passing out also in the past. States he can interrogate his pacer at home and there has been no issue with pacer dysfunction.    Past Medical History:  Diagnosis Date  . Asbestos exposure   . B12 deficiency   . BPH with urinary obstruction    lower urinary tract symptoms  . Carpal tunnel syndrome   . COPD,  moderate ([place])   . Difficulty walking   . Diverticulosis   . Dysphagia   . Dyspnea    unspecified  . Edema   . Elevated PSA   . GERD (gastroesophageal reflux disease)   . Gouty arthropathy   . Hyperlipidemia   . Hypotension   . Memory loss   . Neuropathy    idopathic peripheral  . Onychomycosis   . Orthostatic hypotension   . Patent foramen ovale   . Pre-diabetes   . Presence of permanent cardiac pacemaker   . Proteinuria   . Renal calculus   . 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 Scleroderma ([place])   . Sleep apnea   . Spondylosis of cervical spine   . Stasis dermatitis   . Stroke ([place])   . 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 Urinary frequency   . Varicose veins of leg with edema     There are no active problems to display for this patient.   Past Surgical History:  Procedure Laterality Date  . ANKLE FUSION Left   . COLONOSCOPY WITH PROPOFOL N/A 12/29/2016   Procedure: COLONOSCOPY WITH PROPOFOL;  Surgeon: Lollie Sails, MD;  Location: [facility] ENDOSCOPY;  Service: Endoscopy;  Laterality: N/A;  . COLONOSCOPY WITH PROPOFOL N/A 01/16/2017   Procedure: COLONOSCOPY WITH PROPOFOL;  Surgeon: Lollie Sails, MD;  Location: [facility] ENDOSCOPY;  Service: Endoscopy;  Laterality: N/A;  . ESOPHAGOGASTRODUODENOSCOPY (EGD) WITH PROPOFOL N/A 12/29/2016   Procedure: ESOPHAGOGASTRODUODENOSCOPY (  EGD) WITH PROPOFOL;  Surgeon: Lollie Sails, MD;  Location: [facility] ENDOSCOPY;  Service: Endoscopy;  Laterality: N/A;  . HEMORRHOID SURGERY    . INSERT / REPLACE / REMOVE PACEMAKER    . neck fusion     cervical spine    Prior to Admission medications   Medication Sig Start Date End Date Taking? Authorizing Provider  aspirin 81 MG tablet Take by mouth daily.    [provider]  atorvastatin (LIPITOR) 80 MG tablet Take 80 mg by mouth daily.    [provider]  clopidogrel (PLAVIX) 75 MG tablet Take 75 mg by mouth daily.    [provider]  clotrimazole-betamethasone  (LOTRISONE) cream Apply 1 application topically 2 (two) times daily.    [provider]  finasteride (PROSCAR) 5 MG tablet Take 5 mg by mouth daily.    [provider]  hydroxypropyl methylcellulose / hypromellose (ISOPTO TEARS / GONIOVISC) 2.5 % ophthalmic solution 1 drop.    [provider]  lactobacillus acidophilus (BACID) TABS tablet Take 2 tablets by mouth 3 (three) times daily.    [provider]  meclizine (ANTIVERT) 25 MG tablet Take 25 mg by mouth 3 (three) times daily as needed for dizziness.    [provider]  midodrine (PROAMATINE) 5 MG tablet Take 5 mg by mouth 3 (three) times daily with meals.    [provider]  pantoprazole (PROTONIX) 40 MG tablet Take 40 mg by mouth daily.    [provider]  potassium citrate (UROCIT-K) 10 MEQ (1080 MG) SR tablet Take 10 mEq by mouth 3 (three) times daily with meals.    [provider]  umeclidinium bromide (INCRUSE ELLIPTA) 62.5 MCG/INH AEPB Inhale 1 puff into the lungs daily.    [provider]  vitamin B-12 (CYANOCOBALAMIN) 500 MCG tablet Take 500 mcg by mouth daily.    [provider]    Allergies  Allergen Reactions  . Cellcept [Mycophenolate] Swelling  . Vioxx [Rofecoxib]   . Ciprofloxacin     Family History  Problem Relation Age of Onset  . Pancreatic cancer Mother   . Cancer Sister   . Alzheimer's disease Sister   . ALS Sister   . Lung cancer Brother   . Parkinsonism Brother     Social History Social History  Substance Use Topics  . Smoking status: Never Smoker  . Smokeless tobacco: Never Used  . Alcohol use No    Review of Systems  Constitutional: Negative for fever. Eyes: Negative for visual changes. ENT: Negative for sore throat. Cardiovascular: He had epigastric pressure at the lower chest wall, none currently. Respiratory: Negative for shortness of breath. Gastrointestinal: Negative for diarrhea. Genitourinary:  Negative for dysuria. Musculoskeletal: Negative for back pain. Skin: Negative for rash. Neurological: Negative for headache.  ____________________________________________   PHYSICAL EXAM:  VITAL SIGNS: ED Triage Vitals [02/07/17 1344]  Enc Vitals Group     BP (!) 89/62     Pulse Rate 73     Resp 16     Temp 97.6 F (36.4 C)     Temp Source Oral     SpO2 98 %     Weight 236 lb (107 kg)     Height 5\' 10"  (1.778 m)     Head Circumference      Peak Flow      Pain Score      Pain Loc      Pain Edu?      Excl. in  GC?      Constitutional: Alert and oriented. Well appearing and in no distress. HEENT   Head: Normocephalic and atraumatic.      Eyes: Conjunctivae are normal. Pupils equal and round.       Ears:         Nose: No congestion/rhinnorhea.   Mouth/Throat: Mucous membranes are moist.   Neck: No stridor. Cardiovascular/Chest: Normal rate, regular rhythm.  No murmurs, rubs, or gallops. Respiratory: Normal respiratory effort without tachypnea nor retractions. Breath sounds are clear and equal bilaterally. No wheezes/rales/rhonchi. Gastrointestinal: Soft. No distention, no guarding, no rebound. Nontender.    Genitourinary/rectal:Deferred Musculoskeletal: Nontender with normal range of motion in all extremities. No joint effusions.  No lower extremity tenderness.  No edema. Neurologic:  Normal speech and language. No gross or focal neurologic deficits are appreciated. Skin:  Skin is warm, dry and intact. No rash noted. Psychiatric: Mood and affect are normal. Speech and behavior are normal. Patient exhibits appropriate insight and judgment.   ____________________________________________  LABS (pertinent positives/negatives)  Labs Reviewed  BASIC METABOLIC PANEL - Abnormal; Notable for the following:       Result Value   Glucose, Bld 117 (*)    BUN 22 (*)    GFR calc non Af Amer 58 (*)    All other components within normal limits  CBC - Abnormal; Notable  for the following:    WBC 10.9 (*)    RDW 15.2 (*)    All other components within normal limits  URINALYSIS, COMPLETE (UACMP) WITH MICROSCOPIC - Abnormal; Notable for the following:    Color, Urine AMBER (*)    APPearance HAZY (*)    Protein, ur 30 (*)    All other components within normal limits  GLUCOSE, CAPILLARY  TROPONIN I  CBG MONITORING, ED    ____________________________________________    EKG I, Lisa Roca, MD, the attending physician have personally viewed and interpreted all ECGs.  73 bpm. Normal sinus rhythm. Right bundle branch block. Normal ST and T-wave ____________________________________________  RADIOLOGY All Xrays were viewed by me. Imaging interpreted by Radiologist.  CXR 2 view:  No acute cardio pulmonary disease. __________________________________________  PROCEDURES  Procedure(s) performed: None  Critical Care performed: None  ____________________________________________   ED COURSE / ASSESSMENT AND PLAN  Pertinent labs & imaging results that were available during my care of the patient were reviewed by me and considered in my medical decision making (see chart for details).   Jay Watkins presents having waited multiple hours due to multiple hospital system down time due to a strong, any case he is currently asymptomatic. He presents after an episode what sounds like possible vasovagal syncope, associated with possible esophageal dysfunction. Given location, however and no history of prior TIA/stroke and likely coronary artery disease, will check/at on and send a new troponin. He has a pacemaker, atrially paced rhythm on cardiac monitor. EKG was obtained was when he was in sinus rhythm.  Symptoms today did not sound neurologic in terms of intracranial cause.  Patient and spouse agree.  Not pursuing head imaging at this point.  REassuring evaluation.  I discussed/offered hospital observation admission vs. Home and call Cardilogist office in  the morning, and he would like to go home.  I think this is reasonable, and they are willing to return for any worsening symptoms.  Pacer interrogated, reports no acute findings.    CONSULTATIONS:   Dr. Nehemiah Massed, patients cardiologist by phone - ok for outpatient follow up.   Patient /  Family / Caregiver informed of clinical course, medical decision-making process, and agree with plan.   I discussed return precautions, follow-up instructions, and discharge instructions with patient and/or family.  Discharge Instructions : You were evaluated after passing out episode, and although no certain cause was found, your exam and evaluation are reassuring in the emergency room today.  Return to the emergency department immediately for any worsening symptoms, any chest pain, trouble breathing, weakness, numbness, confusion or altered mental status, passing out, or any other symptoms concerning to you.  Please call Dr. Alveria Apley office in the morning.  ___________________________________________   FINAL CLINICAL IMPRESSION(S) / ED DIAGNOSES   Final diagnoses:  Syncope, unspecified syncope type              Note: This dictation was prepared with Dragon dictation. Any transcriptional errors that result from this process are unintentional    Lisa Roca, MD 02/07/17 2137

## 2017-02-07 NOTE — Discharge Instructions (Signed)
You were evaluated after passing out episode, and although no certain cause was found, your exam and evaluation are reassuring in the emergency room today.  Return to the emergency department immediately for any worsening symptoms, any chest pain, trouble breathing, weakness, numbness, confusion or altered mental status, passing out, or any other symptoms concerning to you.  Please call Dr. Alveria Apley office in the morning.

## 2017-02-19 ENCOUNTER — Ambulatory Visit
Admission: RE | Admit: 2017-02-19 | Discharge: 2017-02-19 | Disposition: A | Discharge status: Home / Self Care | Payer: Medicare HMO | Source: Ambulatory Visit | Attending: Specialist | Admitting: Specialist

## 2017-02-19 DIAGNOSIS — R918 Other nonspecific abnormal finding of lung field: Secondary | ICD-10-CM | POA: Insufficient documentation

## 2017-02-19 DIAGNOSIS — R9389 Abnormal findings on diagnostic imaging of other specified body structures: Secondary | ICD-10-CM

## 2017-02-19 DIAGNOSIS — I77810 Thoracic aortic ectasia: Secondary | ICD-10-CM | POA: Insufficient documentation

## 2017-02-19 DIAGNOSIS — R0602 Shortness of breath: Secondary | ICD-10-CM

## 2017-02-19 DIAGNOSIS — R938 Abnormal findings on diagnostic imaging of other specified body structures: Secondary | ICD-10-CM | POA: Insufficient documentation

## 2017-02-19 DIAGNOSIS — I700 Atherosclerosis of aorta: Secondary | ICD-10-CM | POA: Insufficient documentation

## 2017-03-06 ENCOUNTER — Other Ambulatory Visit: Payer: Self-pay | Admitting: Specialist

## 2017-03-06 DIAGNOSIS — R918 Other nonspecific abnormal finding of lung field: Secondary | ICD-10-CM

## 2017-08-24 ENCOUNTER — Encounter (INDEPENDENT_AMBULATORY_CARE_PROVIDER_SITE_OTHER): Payer: Self-pay | Admitting: Vascular Surgery

## 2017-08-24 ENCOUNTER — Ambulatory Visit (INDEPENDENT_AMBULATORY_CARE_PROVIDER_SITE_OTHER): Payer: Medicare HMO | Admitting: Vascular Surgery

## 2017-08-24 VITALS — BP 120/77 | HR 79 | Resp 17 | Ht 70.0 in | Wt 245.0 lb

## 2017-08-24 DIAGNOSIS — I712 Thoracic aortic aneurysm, without rupture, unspecified: Secondary | ICD-10-CM | POA: Insufficient documentation

## 2017-08-24 DIAGNOSIS — R609 Edema, unspecified: Secondary | ICD-10-CM

## 2017-08-24 DIAGNOSIS — I639 Cerebral infarction, unspecified: Secondary | ICD-10-CM | POA: Diagnosis not present

## 2017-08-24 DIAGNOSIS — E785 Hyperlipidemia, unspecified: Secondary | ICD-10-CM

## 2017-08-24 NOTE — Assessment & Plan Note (Signed)
lipid control important in reducing the progression of atherosclerotic disease. Continue statin therapy  

## 2017-08-24 NOTE — Patient Instructions (Signed)
Thoracic Aortic Aneurysm An aneurysm is a bulge in an artery. It happens when blood pushes up against a weakened or damaged artery wall. A thoracic aortic aneurysm is an aneurysm that occurs in the first part of the aorta, between the heart and the diaphragm. The aorta is the main artery of the body. It supplies blood from the heart to the rest of the body. Some aneurysms may not cause symptoms or problems. However, the major concern with a thoracic aortic aneurysm is that it can enlarge and burst (rupture), or blood can flow between the layers of the wall of the aorta through a tear (aorticdissection). Both of these conditions can cause bleeding inside the body and can be life-threatening if they are not diagnosed and treated right away. What are the causes? The exact cause of this condition is not known. What increases the risk? The following factors may make you more likely to develop this condition:  Being age 65 or older.  Having a hardening of the arteries caused by the buildup of fat and other substances in the lining of a blood vessel (arteriosclerosis).  Having inflammation of the walls of an artery (arteritis).  Having a genetic disease that weakens the body's connective tissue, such as Marfan syndrome.  Having an injury or trauma to the aorta.  Having an infection that is caused by bacteria, such as syphilis or staphylococcus, in the wall of the aorta (infectious aortitis).  Having high blood pressure (hypertension).  Being male.  Being white (Caucasian).  Having high cholesterol.  Having a family history of aneurysms.  Using tobacco.  Having chronic obstructive pulmonary disease (COPD).  What are the signs or symptoms? Symptoms of this condition vary depending on the size and rate of growth of the aneurysm. Most grow slowly and do not cause any symptoms. When symptoms do occur, they may include:  Pain in the chest, back, sides, or abdomen. The pain may vary in  intensity. A sudden onset of severe pain may indicate that the aneurysm has ruptured.  Hoarseness.  Cough.  Shortness of breath.  Swallowing problems.  Swelling in the face, arms, or legs.  Fever.  Unexplained weight loss.  How is this diagnosed? This condition may be diagnosed with:  An ultrasound.  X-rays.  A CT scan.  An MRI.  Tests to check the arteries for damage or blockages (angiogram).  Most unruptured thoracic aortic aneurysms cause no symptoms, so they are often found during exams for other conditions. How is this treated? Treatment for this condition depends on:  The size of the aneurysm.  How fast the aneurysm is growing.  Your age.  Risk factors for rupture.  Aneurysms that are smaller than 2.2 inches (5.5 cm) may be managed by using medicines to control blood pressure, manage pain, or fight infection. You may need regular monitoring to see if the aneurysm is getting bigger. Your health care provider may recommend that you have an ultrasound every year or every 6 months. How often you need to have an ultrasound depends on the size of the aneurysm, how fast it is growing, and whether you have a family history of aneurysms. Surgical repair may be needed if your aneurysm is larger than 2.2 inches or if it is growing quickly. Follow these instructions at home: Eating and drinking  Eat a healthy diet. Your health care provider may recommend that you: ? Lower your salt (sodium) intake. In some people, too much salt can raise blood pressure and increase   the risk of thoracic aortic aneurysm. ? Avoid foods that are high in saturated fat and cholesterol, such as red meat and dairy. ? Eat a diet that is low in sugar. ? Increase your fiber intake by including whole grains, vegetables, and fruits in your diet. Eating these foods may help to lower blood pressure.  Limit or avoid alcohol as recommended by your health care provider. Lifestyle  Follow instructions  from your health care provider about healthy lifestyle habits. Your health care provider may recommend that you: ? Do not use any products that contain nicotine or tobacco, such as cigarettes and e-cigarettes. If you need help quitting, ask your health care provider. ? Keep your blood pressure within normal limits. The target limit for most people is below 120/80. Check your blood pressure regularly. If it is high, ask your health care provider about ways that you can control it. ? Keep your blood sugar (glucose) level and cholesterol levels within normal limits. Target limits for most people are:  Blood glucose level: Less than 100 mg/dL.  Total cholesterol level: Less than 200 mg/dL. ? Maintain a healthy weight. Activity  Stay physically active and exercise regularly. Talk with your health care provider about how often you should exercise and ask which types of exercise are safe for you.  Avoid heavy lifting and activities that take a lot of effort (are strenuous). Ask your health care provider what activities are safe for you. General instructions  Keep all follow-up visits as told by your health care provider. This is important. ? Talk with your health care provider about regular screenings to see if the aneurysm is getting bigger.  Take over-the-counter and prescription medicines only as told by your health care provider. Contact a health care provider if:  You have discomfort in your upper back, neck, or abdomen.  You have trouble swallowing.  You have a cough or hoarseness.  You have a family history of aneurysms.  You have unexplained weight loss. Get help right away if:  You have sudden, severe pain in your upper back and abdomen. This pain may move into your chest and arms.  You have shortness of breath.  You have a fever. This information is not intended to replace advice given to you by your health care provider. Make sure you discuss any questions you have with your  health care provider. Document Released: 06/19/2005 Document Revised: 03/31/2016 Document Reviewed: 03/31/2016 Elsevier Interactive Patient Education  2018 [employer]  

## 2017-08-24 NOTE — Assessment & Plan Note (Signed)
Does not seem to have a significant residual deficit.

## 2017-08-24 NOTE — Assessment & Plan Note (Signed)
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 We had a lengthy discussion today regarding the pathophysiology and natural history of thoracic aortic aneurysms.  This is a very small thoracic aneurysm.  It does not require any specific treatment.  He says his blood pressure already runs low, and the blood pressure control should not be an issue.  He no longer smokes.  I discussed that this should be followed on a roughly annual basis.  He was last checked about 6 months ago so we will plan a CT scan of the chest in about 6 months for follow-up.

## 2017-08-24 NOTE — Progress Notes (Signed)
Patient ID: Jay Watkins, male   DOB: 07/20/1940, 77 y.o.   MRN: [id number]  Chief Complaint  Patient presents with  . New Patient (Initial Visit)    ref Raul Del Thorasic Aneurysm    HPI Jay Watkins is a 77 y.o. male.  I am asked to see the patient by Dr. Raul Del for evaluation of thoracic aortic aneurysm.  The patient reports being evaluated with chest CT by his rheumatologist.  He has scleroderma and other medical issues.  He has had multiple chest CTs in the past in [place] before they moved to [place] but did not demonstrate any aneurysm or none that was reported.  He denies any chest pain, back pain, or signs of peripheral embolization.  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   Past Medical History:  Diagnosis Date  . Asbestos exposure   . B12 deficiency   . BPH with urinary obstruction    lower urinary tract symptoms  . Carpal tunnel syndrome   . COPD, moderate ([place])   . Difficulty walking   . Diverticulosis   . Dysphagia   . Dyspnea    unspecified  . Edema   . Elevated PSA   . GERD (gastroesophageal reflux disease)   . Gouty arthropathy   . Hyperlipidemia   . Hypotension   . Memory loss   . Neuropathy    idopathic peripheral  . Onychomycosis   . Orthostatic hypotension   . Patent foramen ovale   . Pre-diabetes   . Presence of permanent cardiac pacemaker   . Proteinuria   . Renal calculus   . 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 Scleroderma ([place])   . Sleep apnea   . Spondylosis of cervical spine   . Stasis dermatitis   . Stroke ([place])   . 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 Urinary frequency   . Varicose veins of leg with edema     Past Surgical History:  Procedure Laterality Date  . ANKLE FUSION Left   . COLONOSCOPY WITH PROPOFOL N/A 12/29/2016   Procedure: COLONOSCOPY WITH PROPOFOL;  Surgeon: Lollie Sails, MD;  Location: [facility] ENDOSCOPY;  Service: Endoscopy;   Laterality: N/A;  . COLONOSCOPY WITH PROPOFOL N/A 01/16/2017   Procedure: COLONOSCOPY WITH PROPOFOL;  Surgeon: Lollie Sails, MD;  Location: [facility] ENDOSCOPY;  Service: Endoscopy;  Laterality: N/A;  . ESOPHAGOGASTRODUODENOSCOPY (EGD) WITH PROPOFOL N/A 12/29/2016   Procedure: ESOPHAGOGASTRODUODENOSCOPY (EGD) WITH PROPOFOL;  Surgeon: Lollie Sails, MD;  Location: [facility] ENDOSCOPY;  Service: Endoscopy;  Laterality: N/A;  . HEMORRHOID SURGERY    . INSERT / REPLACE / REMOVE PACEMAKER    . neck fusion     cervical spine    Family History  Problem Relation Age of Onset  . Pancreatic cancer Mother   . Cancer Sister   . Alzheimer's disease Sister   . ALS Sister   . Lung cancer Brother   . Parkinsonism Brother     Social History Social History   Tobacco Use  . Smoking status: Never Smoker  . Smokeless tobacco: Never Used  Substance Use Topics  . Alcohol use: No  . Drug use: No     Allergies  Allergen Reactions  . Cellcept [Mycophenolate] Swelling  . Vioxx [Rofecoxib]   . Ciprofloxacin     Current Outpatient Medications  Medication Sig Dispense Refill  . albuterol (PROVENTIL HFA;VENTOLIN HFA)  108 (90 Base) MCG/ACT inhaler Inhale into the lungs.    Marland Kitchen aspirin 81 MG tablet Take by mouth daily.    Marland Kitchen atorvastatin (LIPITOR) 80 MG tablet Take 80 mg by mouth daily.    . clopidogrel (PLAVIX) 75 MG tablet Take 75 mg by mouth daily.    . finasteride (PROSCAR) 5 MG tablet Take 5 mg by mouth daily.    . methocarbamol (ROBAXIN) 500 MG tablet Take 500 mg by mouth 4 (four) times daily.    . midodrine (PROAMATINE) 5 MG tablet Take 5 mg by mouth 3 (three) times daily with meals.    . pantoprazole (PROTONIX) 40 MG tablet Take 40 mg by mouth daily.    Marland Kitchen umeclidinium bromide (INCRUSE ELLIPTA) 62.5 MCG/INH AEPB Inhale 1 puff into the lungs daily.    . vitamin B-12 (CYANOCOBALAMIN) 500 MCG tablet Take 500 mcg by mouth daily.    . clotrimazole-betamethasone (LOTRISONE) cream Apply 1 application  topically 2 (two) times daily.    . hydroxypropyl methylcellulose / hypromellose (ISOPTO TEARS / GONIOVISC) 2.5 % ophthalmic solution 1 drop.    . lactobacillus acidophilus (BACID) TABS tablet Take 2 tablets by mouth 3 (three) times daily.    . meclizine (ANTIVERT) 25 MG tablet Take 25 mg by mouth 3 (three) times daily as needed for dizziness.    . potassium citrate (UROCIT-K) 10 MEQ (1080 MG) SR tablet Take 10 mEq by mouth 3 (three) times daily with meals.     No current facility-administered medications for this visit.       REVIEW OF SYSTEMS (Negative unless checked)  Constitutional: [] Weight loss  [] Fever  [] Chills Cardiac: [] Chest pain   [] Chest pressure   [x] Palpitations   [] Shortness of breath when laying flat   [] Shortness of breath at rest   [x] Shortness of breath with exertion. Vascular:  [] Pain in legs with walking   [] Pain in legs at rest   [] Pain in legs when laying flat   [] Claudication   [] Pain in feet when walking  [] Pain in feet at rest  [] Pain in feet when laying flat   [] History of DVT   [] Phlebitis   [x] Swelling in legs   [] Varicose veins   [] Non-healing ulcers Pulmonary:   [] Uses home oxygen   [x] Productive cough   [] Hemoptysis   [] Wheeze  [x] COPD   [] Asthma Neurologic:  [] Dizziness  [] Blackouts   [] Seizures   [x] History of stroke   [] History of TIA  [] Aphasia   [] Temporary blindness   [] Dysphagia   [] Weakness or numbness in arms   [] Weakness or numbness in legs Musculoskeletal:  [x] Arthritis   [] Joint swelling   [] Joint pain   [] Low back pain Hematologic:  [] Easy bruising  [] Easy bleeding   [] Hypercoagulable state   [] Anemic  [] Hepatitis Gastrointestinal:  [] Blood in stool   [] Vomiting blood  [x] Gastroesophageal reflux/heartburn   [] Abdominal pain Genitourinary:  [] Chronic kidney disease   [] Difficult urination  [] Frequent urination  [] Burning with urination   [] Hematuria Skin:  [] Rashes   [] Ulcers   [] Wounds Psychological:  [] History of anxiety   []  History of major  depression.    Physical Exam BP 120/77 (BP Location: Right Arm)   Pulse 79   Resp 17   Ht 5\' 10"  (1.778 m)   Wt 111.1 kg (245 lb)   BMI 35.15 kg/m  Gen:  WD/WN, NAD Head: [place]/AT, No temporalis wasting.  Ear/Nose/Throat: Hearing grossly intact, nares w/o erythema or drainage, oropharynx w/o Erythema/Exudate Eyes: Conjunctiva clear, sclera non-icteric  Neck: trachea  midline.  No JVD.  Pulmonary:  Good air movement, respirations not labored, no use of accessory muscles Cardiac: RRR Vascular:  Vessel Right Left  Radial Palpable Palpable                                   Gastrointestinal: soft, non-tender/non-distended Musculoskeletal: M/S 5/5 throughout.  Extremities without ischemic changes.  No deformity or atrophy.  Mild lower extremity edema. Neurologic: Sensation grossly intact in extremities.  Symmetrical.  Speech is fluent. Motor exam as listed above. Psychiatric: Judgment intact, Mood & affect appropriate for pt's clinical situation. Dermatologic: No rashes or ulcers noted.  No cellulitis or open wounds.    Radiology No results found.  Labs No results found for this or any previous visit (from the past 2160 hour(s)).  Assessment/Plan:  Stroke [employer]) Does not seem to have a significant residual deficit.  Edema Controlled with elevation and compression  Hyperlipidemia lipid control important in reducing the progression of atherosclerotic disease. 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 We had a lengthy discussion today regarding the pathophysiology and natural history of thoracic aortic aneurysms.  This is a very small thoracic aneurysm.  It does not require any specific treatment.  He says his blood pressure already runs low, and the blood pressure control should not be an issue.  He no longer smokes.   I discussed that this should be followed on a roughly annual basis.  He was last checked about 6 months ago so we will plan a CT scan of the chest in about 6 months for follow-up.      Leotis Pain 08/24/2017, 4:40 PM   This note was created with Dragon medical transcription system.  Any errors from dictation are unintentional.

## 2017-08-24 NOTE — Assessment & Plan Note (Signed)
Controlled with elevation and compression

## 2017-08-28 IMAGING — RF DG ESOPHAGUS
14 of 15 series · 14 of 15 positions shown · non-contrast
Comparison: CT scan of the chest August 09, 2016

CLINICAL DATA: History of scleroderma. Increased burping and gas
and reflux symptoms. History of previous esophageal dilation. The
patient requires extra fluids and small food particles size to avoid
difficulty.

EXAM:
ESOPHOGRAM / BARIUM SWALLOW / BARIUM TABLET STUDY
TECHNIQUE: Combined double contrast and single contrast examination performed
using effervescent crystals, thick barium liquid, and thin barium
liquid. The patient was observed with fluoroscopy swallowing a 13 mm
barium sulphate tablet.
FLUOROSCOPY TIME:  Fluoroscopy Time:  1 minutes
Radiation Exposure Index (if provided by the fluoroscopic device):
3394 micro Gy per meters square
Number of Acquired Spot Images: 15

[Series 1: fluoro_barium 2fps_bw · 0.18mm/px · 1 of 1 slices shown (1 of 14)]
[im 1/1]
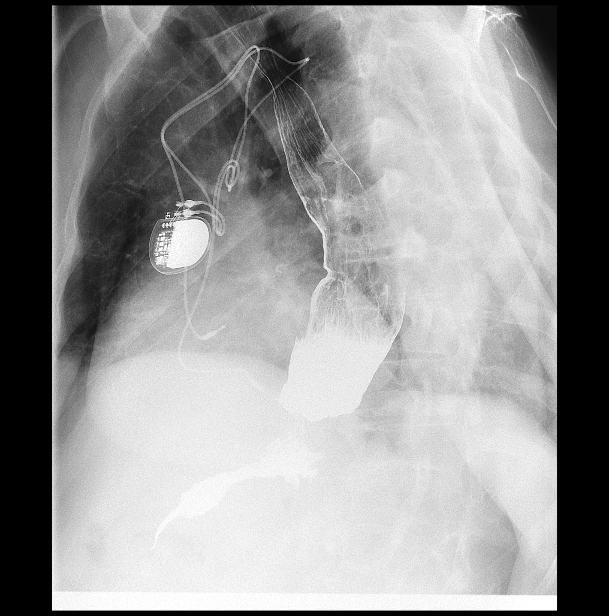

[Series 2: fluoro_barium 2fps_bw · 0.18mm/px · 1 of 1 slices shown (2 of 14)]
[im 1/1]
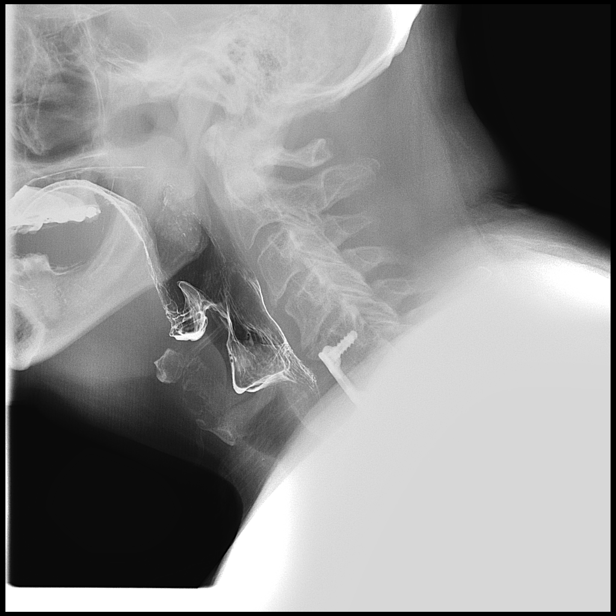

[Series 3: fluoro_barium 2fps_bw · 0.17mm/px · 1 of 1 slices shown (3 of 14)]
[im 1/1]
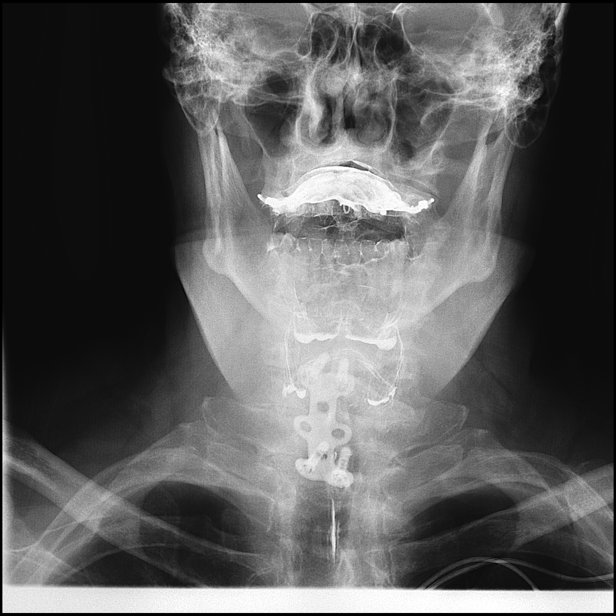

[Series 4: fluoro_barium 2fps_bw · 0.17mm/px · 1 of 1 slices shown (4 of 14)]
[im 1/1]
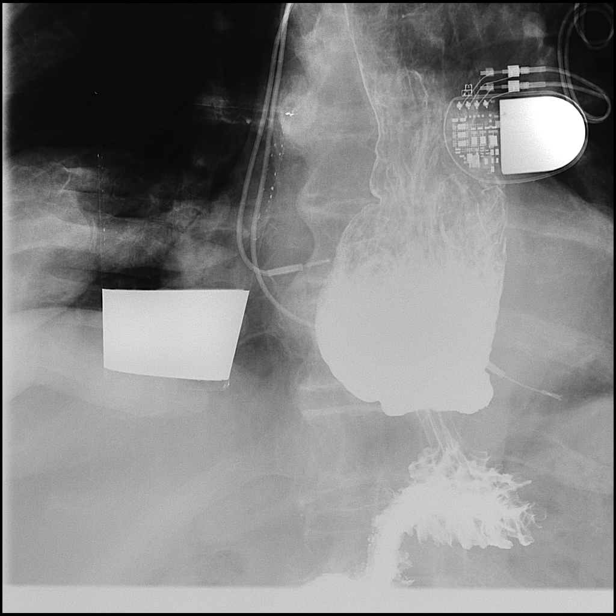

[Series 5: fluoro_barium 2fps_bw · 0.17mm/px · 1 of 1 slices shown (5 of 14)]
[im 1/1]
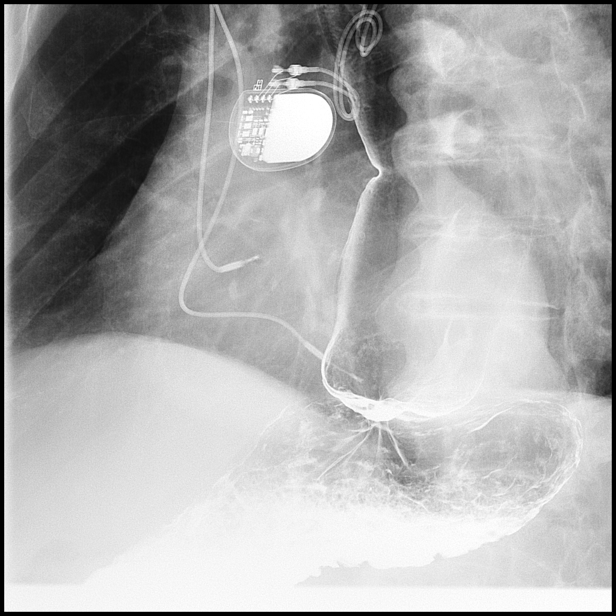

[Series 6: fluoro_barium 2fps_bw · 0.17mm/px · 1 of 1 slices shown (6 of 14)]
[im 1/1]
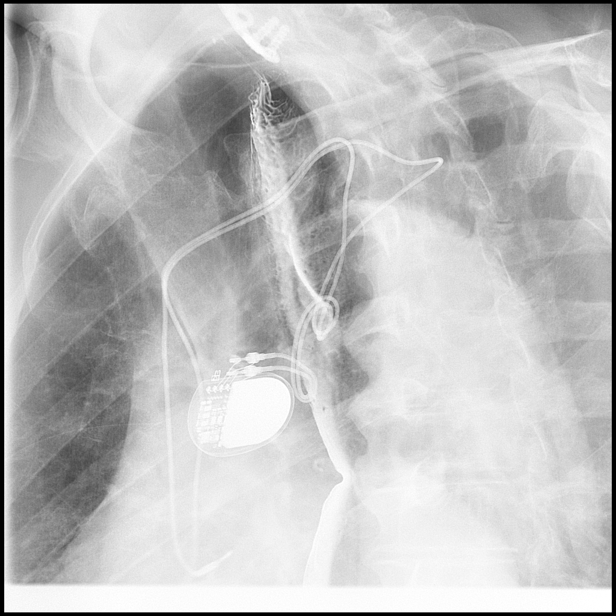

[Series 7: fluoro_barium 2fps_bw · 0.17mm/px · 1 of 1 slices shown (7 of 14)]
[im 1/1]
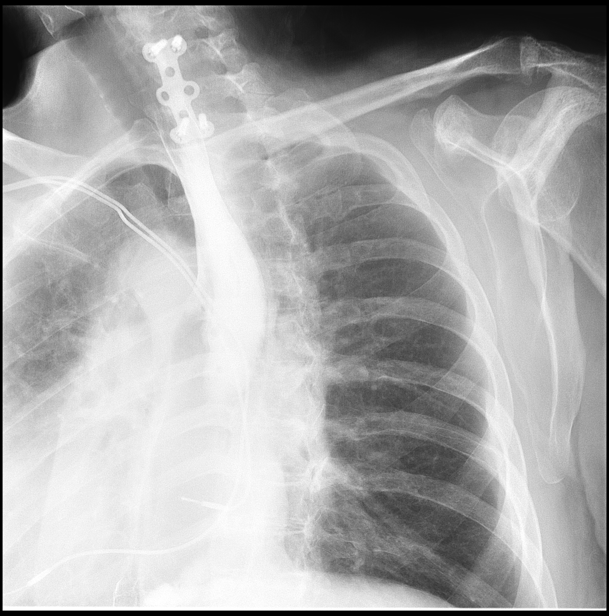

[Series 9: fluoro_barium 2fps_bw · 0.18mm/px · 1 of 1 slices shown (8 of 14)]
[im 1/1]
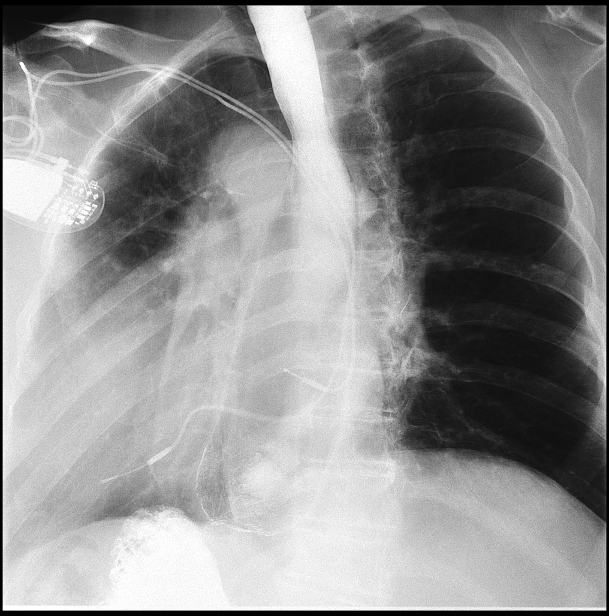

[Series 10: fluoro_barium 2fps_bw · 0.18mm/px · 1 of 1 slices shown (9 of 14)]
[im 1/1]
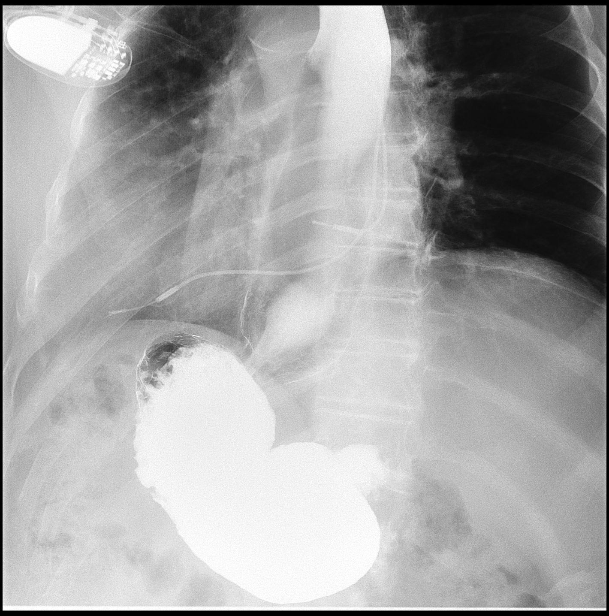

[Series 11: fluoro_barium 2fps_bw · 0.18mm/px · 1 of 1 slices shown (10 of 14)]
[im 1/1]
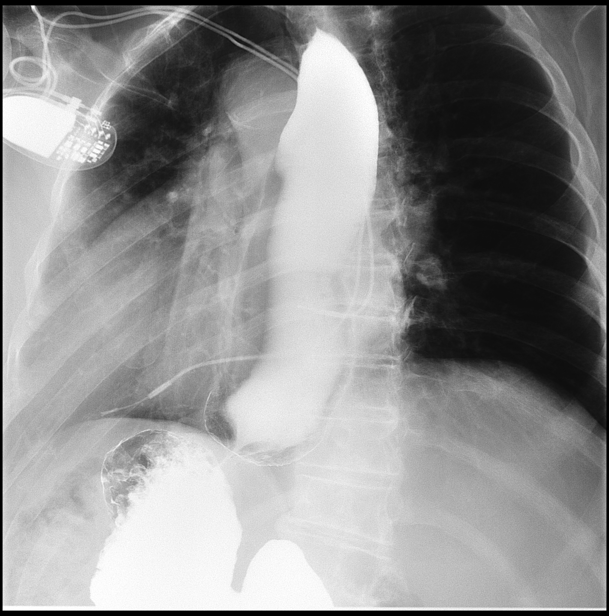

[Series 12: fluoro_barium 2fps_bw · 0.18mm/px · 1 of 1 slices shown (11 of 14)]
[im 1/1]
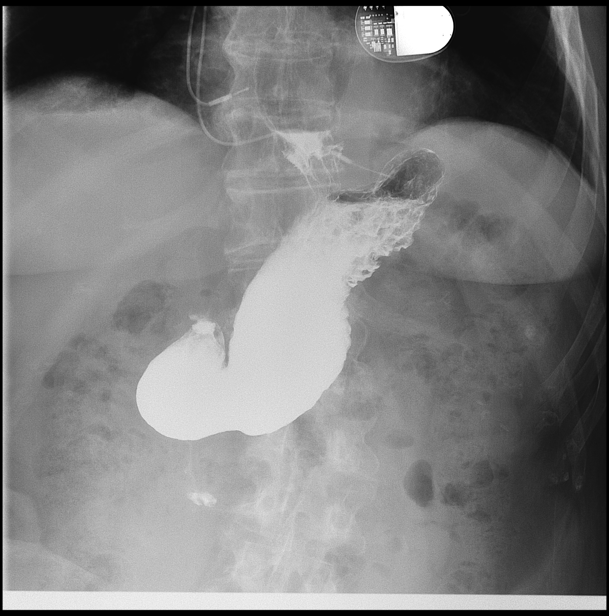

[Series 13: fluoro_barium 2fps_bw · 0.18mm/px · 1 of 1 slices shown (12 of 14)]
[im 1/1]
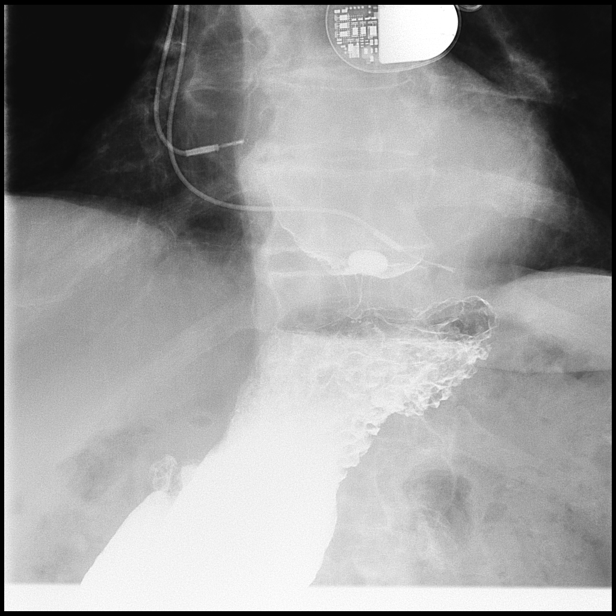

[Series 14: fluoro_barium 2fps_bw · 0.18mm/px · 1 of 1 slices shown (13 of 14)]
[im 1/1]
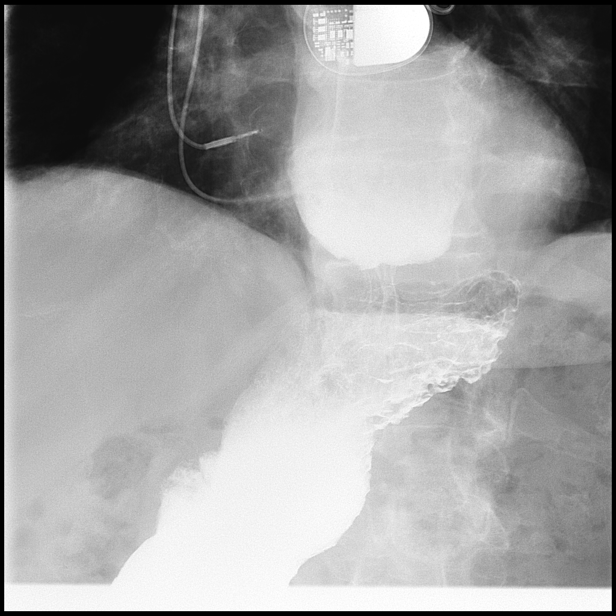

[Series 15: fluoro_barium 2fps_bw · 0.18mm/px · 1 of 1 slices shown (14 of 14)]
[im 1/1]
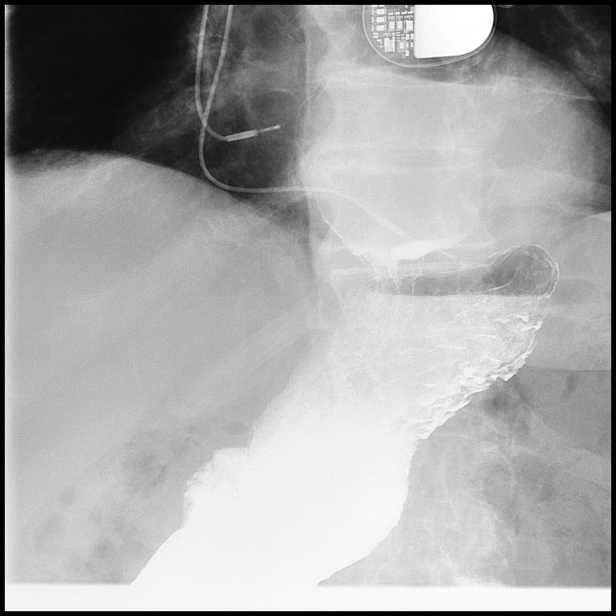

[14 of 15 positions shown; findings below may reference images not displayed]

FINDINGS: The patient ingested thick and thin barium and the gas-forming
crystals without difficulty. The cervical esophagus distended well.
There was no laryngeal penetration of the barium. The patient has
undergone anterior cervical fusion at C5-6. No significant mass
effect upon the posterior wall of the proximal cervical esophagus is
observed.

The proximal thoracic esophagus is normal in caliber. The mid and
distal portions are mildly dilated. The esophageal muscular function
appears normal in the proximal third of the esophagus but is absent
in the mid and distal esophagus which is a statically dilated. The
mucosal surface appears normal. Barium traverses the esophagus and
reaches the GE junction where there is delay in relaxation of the
sphincter an slow passage of the barium. The barium tablet hung
here. In the prone position there is no significant muscular
function observed in the mid and distal esophagus.
IMPRESSION: Dilation of the mid and distal esophagus without significant
muscular function. In the prone position the ingested barium merely
pools once propelled here from the proximal esophagus. In the
upright position barium does pass into the stomach via the narrowed
GE junction. Direct visualization is recommended.

No evidence of esophagitis or mucosal or submucosal esophageal
masses.

No laryngeal penetration of the barium.

## 2017-08-31 ENCOUNTER — Encounter (INDEPENDENT_AMBULATORY_CARE_PROVIDER_SITE_OTHER): Payer: Self-pay | Admitting: Specialist

## 2017-11-12 ENCOUNTER — Inpatient Hospital Stay: Admit: 2017-11-12 | Discharge: 2017-11-12 | Disposition: A | Discharge status: Home / Self Care | Payer: MEDICARE

## 2017-11-12 DIAGNOSIS — M5442 Lumbago with sciatica, left side: Secondary | ICD-10-CM

## 2017-11-12 MED ORDER — PREDNISONE 20 MG TAB
20 mg | ORAL_TABLET | Freq: Every day | ORAL | 0 refills | Status: AC
Start: 2017-11-12 — End: 2017-11-17

## 2017-11-12 MED ORDER — TRAMADOL 50 MG TAB
50 mg | ORAL_TABLET | Freq: Four times a day (QID) | ORAL | 0 refills | Status: AC | PRN
Start: 2017-11-12 — End: 2017-11-15
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
## 2017-11-12 NOTE — Other (Signed)
Pt states he has had left low back pain x3 weeks; shooting pain in left leg x3 days.

## 2017-11-18 ENCOUNTER — Inpatient Hospital Stay: Admit: 2017-11-18 | Discharge: 2017-11-18 | Disposition: A | Discharge status: Home / Self Care | Payer: MEDICARE

## 2017-11-18 DIAGNOSIS — M5442 Lumbago with sciatica, left side: Secondary | ICD-10-CM

## 2017-11-18 MED ORDER — GABAPENTIN 100 MG CAP
100 mg | ORAL_CAPSULE | Freq: Three times a day (TID) | ORAL | 0 refills | Status: AC
Start: 2017-11-18 — End: ?

## 2017-11-18 MED ORDER — PREDNISONE 20 MG TAB
20 mg | ORAL_TABLET | Freq: Every day | ORAL | 0 refills | Status: AC
Start: 2017-11-18 — End: 2017-11-23

## 2017-11-18 MED ORDER — TIZANIDINE 2 MG CAP
2 mg | ORAL_CAPSULE | Freq: Three times a day (TID) | ORAL | 0 refills | Status: AC | PRN
Start: 2017-11-18 — End: ?
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
## 2017-11-18 NOTE — Other (Signed)
Pt. Was seen on Monday he took his prednisone and tramadol he continues to have lower back pain , he is unsure what to do for the pain tramadol worked for 2 days and stopped working pt. Is heading back to [place] on Thursday no issues with bowels or bladder

## 2018-02-04 IMAGING — CR DG CHEST 2V
1 series · 2 of 2 positions shown · non-contrast
Comparison: Chest CT, 08/09/2016

CLINICAL DATA: Epigastric pain and nausea beginning earlier today.

EXAM:
CHEST  2 VIEW

[Series 1: dg chest 2 view · 0.14mm/px · 2 of 2 slices shown]
[im 1/2]
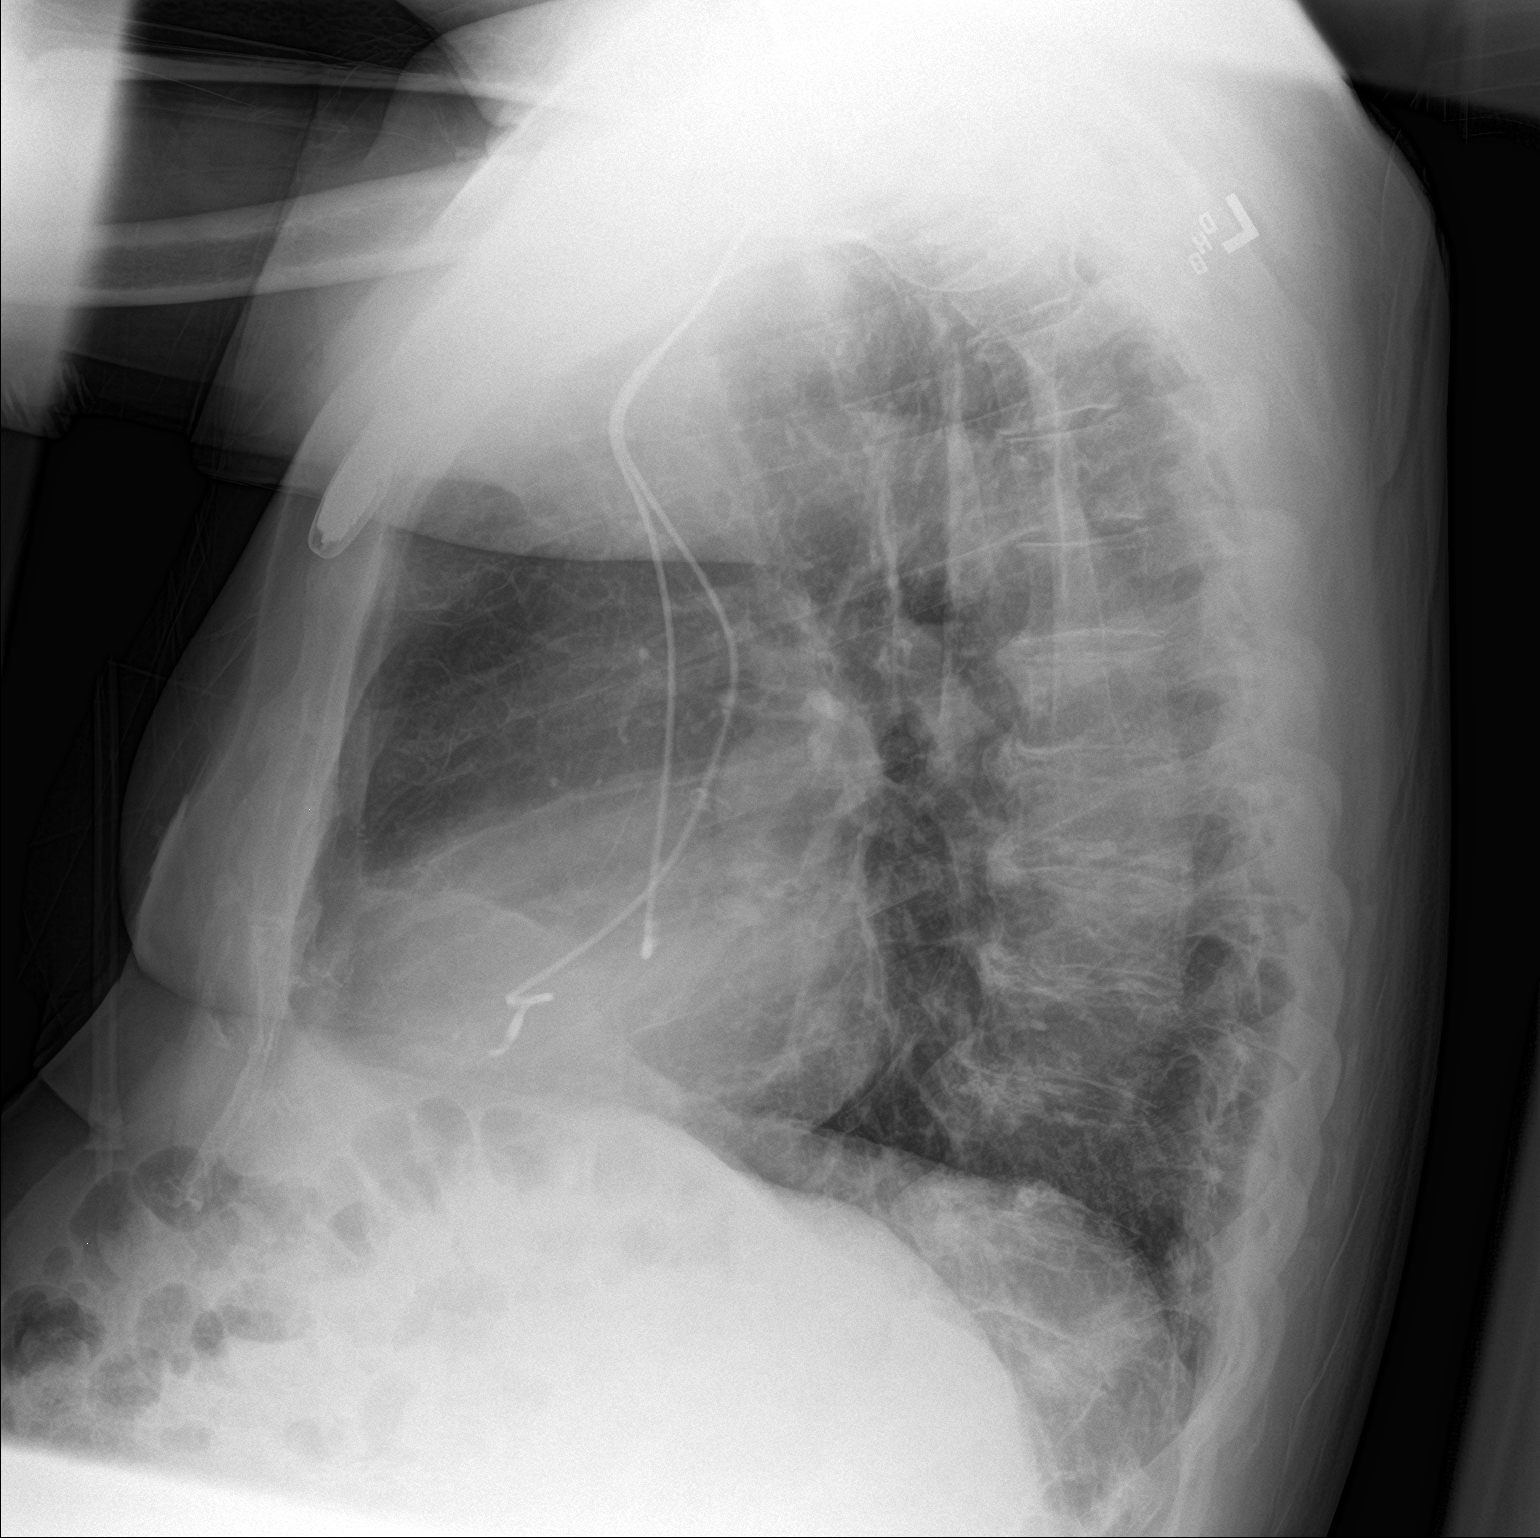
[im 2/2]
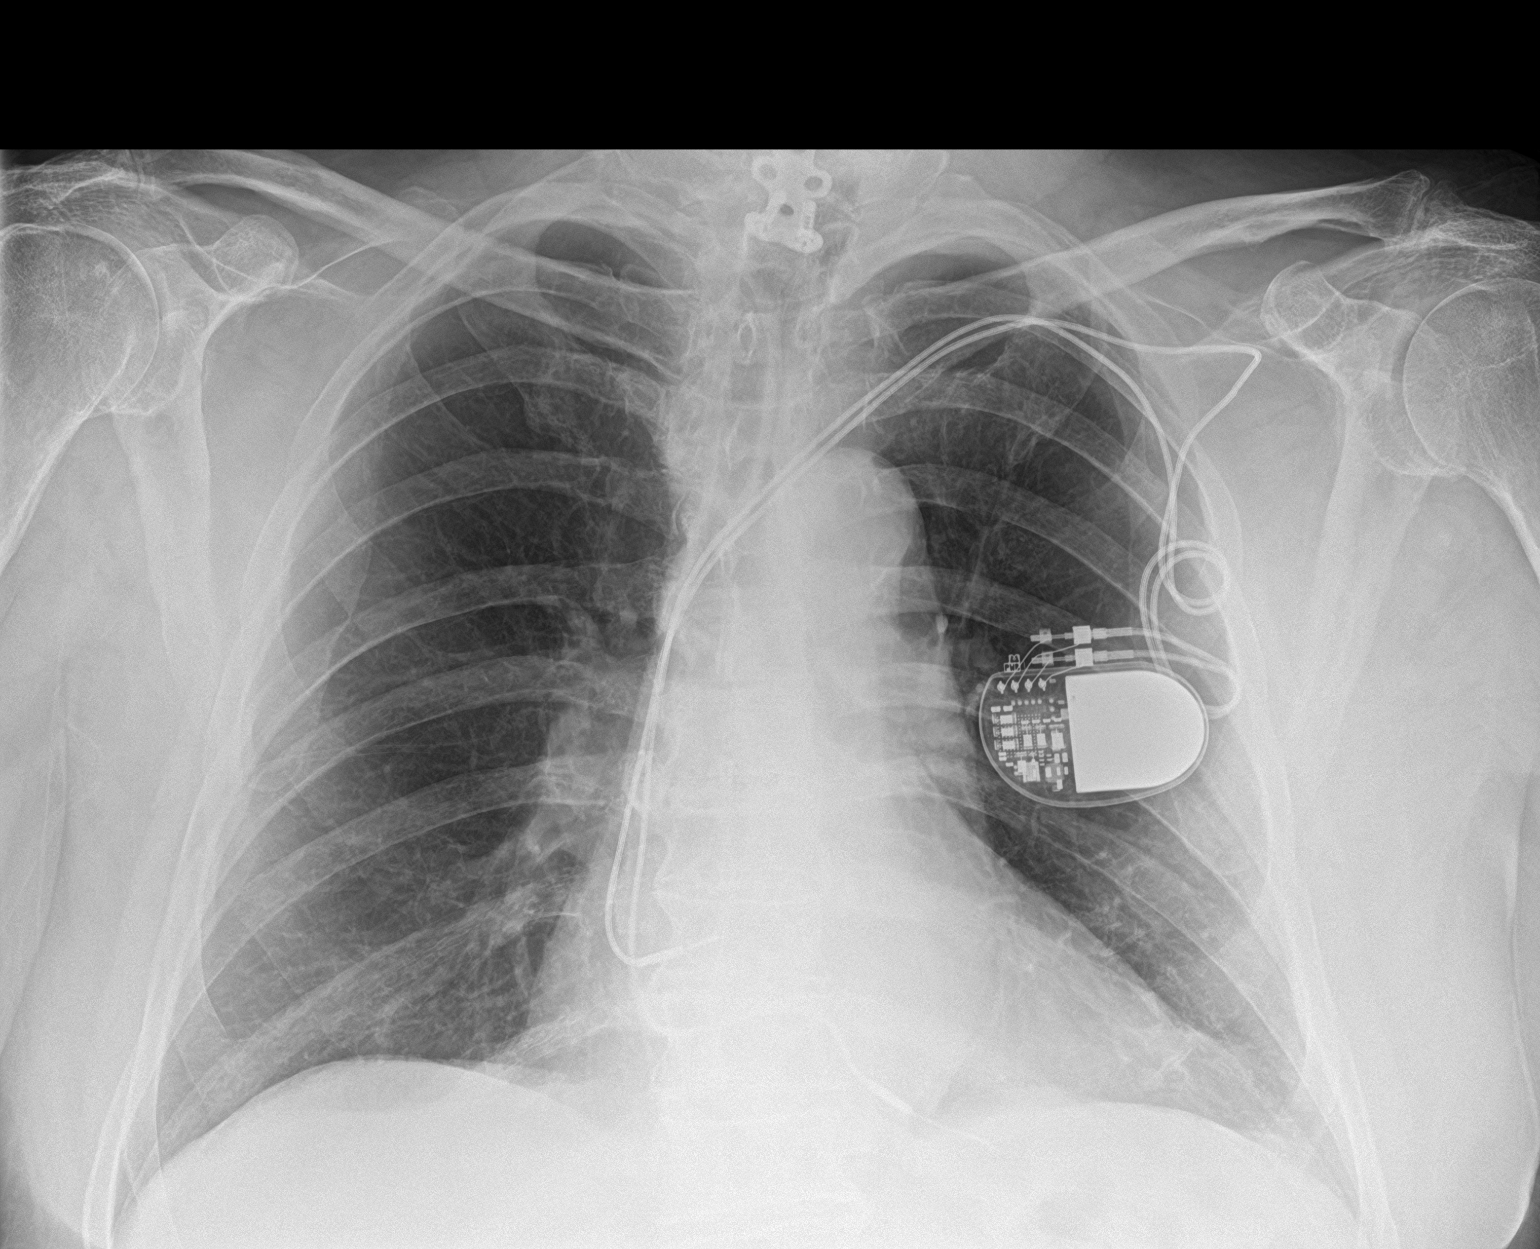

[2 of 2 positions shown; findings below may reference images not displayed]

FINDINGS: Cardiac silhouette is mildly enlarged. No mediastinal or hilar
masses. No evidence of adenopathy.

Lungs are clear.  No pleural effusion or pneumothorax.

Left anterior chest wall sequential pacemaker is well positioned.

There is been a previous anterior cervical spine fusion. Skeletal
structures are demineralized.
IMPRESSION: No acute cardiopulmonary disease.

## 2018-02-11 ENCOUNTER — Other Ambulatory Visit: Payer: Medicare HMO

## 2018-02-11 ENCOUNTER — Ambulatory Visit
Admission: RE | Admit: 2018-02-11 | Discharge: 2018-02-11 | Disposition: A | Discharge status: Home / Self Care | Payer: Medicare HMO | Source: Ambulatory Visit | Attending: Specialist | Admitting: Specialist

## 2018-02-11 ENCOUNTER — Other Ambulatory Visit: Payer: Self-pay | Admitting: Family Medicine

## 2018-02-11 DIAGNOSIS — G8929 Other chronic pain: Secondary | ICD-10-CM | POA: Diagnosis present

## 2018-02-11 DIAGNOSIS — R918 Other nonspecific abnormal finding of lung field: Secondary | ICD-10-CM | POA: Diagnosis not present

## 2018-02-11 DIAGNOSIS — M5136 Other intervertebral disc degeneration, lumbar region: Secondary | ICD-10-CM | POA: Diagnosis not present

## 2018-02-11 DIAGNOSIS — I700 Atherosclerosis of aorta: Secondary | ICD-10-CM | POA: Insufficient documentation

## 2018-02-11 DIAGNOSIS — M5442 Lumbago with sciatica, left side: Secondary | ICD-10-CM | POA: Diagnosis present

## 2018-02-11 DIAGNOSIS — M4687 Other specified inflammatory spondylopathies, lumbosacral region: Secondary | ICD-10-CM | POA: Diagnosis not present

## 2018-02-11 DIAGNOSIS — M48061 Spinal stenosis, lumbar region without neurogenic claudication: Secondary | ICD-10-CM | POA: Insufficient documentation

## 2018-02-11 DIAGNOSIS — I313 Pericardial effusion (noninflammatory): Secondary | ICD-10-CM | POA: Diagnosis not present

## 2018-02-19 ENCOUNTER — Ambulatory Visit: Payer: Medicare HMO

## 2018-03-01 ENCOUNTER — Encounter (INDEPENDENT_AMBULATORY_CARE_PROVIDER_SITE_OTHER): Payer: Self-pay | Admitting: Vascular Surgery

## 2018-03-01 ENCOUNTER — Ambulatory Visit (INDEPENDENT_AMBULATORY_CARE_PROVIDER_SITE_OTHER): Payer: Medicare HMO | Admitting: Vascular Surgery

## 2018-03-01 VITALS — BP 112/76 | HR 77 | Resp 12 | Ht 71.0 in | Wt 245.0 lb

## 2018-03-01 DIAGNOSIS — I712 Thoracic aortic aneurysm, without rupture, unspecified: Secondary | ICD-10-CM

## 2018-03-01 DIAGNOSIS — E785 Hyperlipidemia, unspecified: Secondary | ICD-10-CM

## 2018-03-01 DIAGNOSIS — R609 Edema, unspecified: Secondary | ICD-10-CM

## 2018-03-01 DIAGNOSIS — I639 Cerebral infarction, unspecified: Secondary | ICD-10-CM | POA: Diagnosis not present

## 2018-03-01 NOTE — Assessment & Plan Note (Signed)
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  This has not shown dramatic growth. This is well below the threshold for prophylactic repair.  He is on antiplatelet therapy and a statin agent.  Blood pressure control is important in avoiding growth of this.  This can be rechecked in 1 year with a CT scan

## 2018-03-01 NOTE — Progress Notes (Signed)
MRN : [id number]  Jay Watkins is a 77 y.o. ([birth year]) male who presents with chief complaint of  Chief Complaint  Patient presents with  . Follow-up    CT results  .  History of Present Illness: Patient returns today in follow up of his thoracic aortic aneurysm.  He has been having some back trouble but no chest pain, palpitations, or signs of peripheral embolization.  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  This has not shown dramatic growth.  Current Outpatient Medications  Medication Sig Dispense Refill  . albuterol (PROVENTIL HFA;VENTOLIN HFA) 108 (90 Base) MCG/ACT inhaler Inhale into the lungs.    Marland Kitchen aspirin 81 MG tablet Take by mouth daily.    Marland Kitchen atorvastatin (LIPITOR) 80 MG tablet Take 80 mg by mouth daily.    . clopidogrel (PLAVIX) 75 MG tablet Take 75 mg by mouth daily.    . clotrimazole-betamethasone (LOTRISONE) cream Apply 1 application topically 2 (two) times daily.    . finasteride (PROSCAR) 5 MG tablet Take 5 mg by mouth daily.    Marland Kitchen gabapentin (NEURONTIN) 100 MG capsule Take 100 mg twice a day for one week, then increase to 200 mg(2 tablets) twice a day and continue    . hydroxypropyl methylcellulose / hypromellose (ISOPTO TEARS / GONIOVISC) 2.5 % ophthalmic solution 1 drop.    . lactobacillus acidophilus (BACID) TABS tablet Take 2 tablets by mouth 3 (three) times daily.    . meclizine (ANTIVERT) 25 MG tablet Take 25 mg by mouth 3 (three) times daily as needed for dizziness.    . methocarbamol (ROBAXIN) 500 MG tablet Take 500 mg by mouth 4 (four) times daily.    . midodrine (PROAMATINE) 5 MG tablet Take 5 mg by mouth 3 (three) times daily with meals.    . pantoprazole (PROTONIX) 40 MG tablet Take 40 mg by mouth daily.    . potassium citrate (UROCIT-K) 10 MEQ (1080 MG) SR tablet Take 10 mEq by mouth 3 (three) times daily with meals.    . ranitidine (ZANTAC) 150 MG tablet  Take by mouth.    . tizanidine (ZANAFLEX) 2 MG capsule Take by mouth.    . traMADol (ULTRAM) 50 MG tablet     . umeclidinium bromide (INCRUSE ELLIPTA) 62.5 MCG/INH AEPB Inhale 1 puff into the lungs daily.    . vitamin B-12 (CYANOCOBALAMIN) 500 MCG tablet Take 500 mcg by mouth daily.     No current facility-administered medications for this visit.     Past Medical History:  Diagnosis Date  . Asbestos exposure   . B12 deficiency   . BPH with urinary obstruction    lower urinary tract symptoms  . Carpal tunnel syndrome   . COPD, moderate ([place])   . Difficulty walking   . Diverticulosis   . Dysphagia   . Dyspnea    unspecified  . Edema   . Elevated PSA   . GERD (gastroesophageal reflux disease)   . Gouty arthropathy   . Hyperlipidemia   . Hypotension   . Memory loss   . Neuropathy    idopathic peripheral  . Onychomycosis   . Orthostatic hypotension   . Patent foramen ovale   . Pre-diabetes   . Presence of permanent cardiac pacemaker   . Proteinuria   . Renal calculus   . 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 Scleroderma ([place])   .  Sleep apnea   . Spondylosis of cervical spine   . Stasis dermatitis   . Stroke ([place])   . 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 Urinary frequency   . Varicose veins of leg with edema     Past Surgical History:  Procedure Laterality Date  . ANKLE FUSION Left   . COLONOSCOPY WITH PROPOFOL N/A 12/29/2016   Procedure: COLONOSCOPY WITH PROPOFOL;  Surgeon: Lollie Sails, MD;  Location: [facility] ENDOSCOPY;  Service: Endoscopy;  Laterality: N/A;  . COLONOSCOPY WITH PROPOFOL N/A 01/16/2017   Procedure: COLONOSCOPY WITH PROPOFOL;  Surgeon: Lollie Sails, MD;  Location: [facility] ENDOSCOPY;  Service: Endoscopy;  Laterality: N/A;  . ESOPHAGOGASTRODUODENOSCOPY (EGD) WITH PROPOFOL N/A 12/29/2016   Procedure: ESOPHAGOGASTRODUODENOSCOPY (EGD) WITH PROPOFOL;  Surgeon: Lollie Sails, MD;  Location: [facility] ENDOSCOPY;  Service: Endoscopy;  Laterality: N/A;  . HEMORRHOID  SURGERY    . INSERT / REPLACE / REMOVE PACEMAKER    . neck fusion     cervical spine    Family History  Problem Relation Age of Onset  . Pancreatic cancer Mother   . Cancer Sister   . Alzheimer's disease Sister   . ALS Sister   . Lung cancer Brother   . Parkinsonism Brother     Social History Social History       Tobacco Use  . Smoking status: Never Smoker  . Smokeless tobacco: Never Used  Substance Use Topics  . Alcohol use: No  . Drug use: No         Allergies  Allergen Reactions  . Cellcept [Mycophenolate] Swelling  . Vioxx [Rofecoxib]   . Ciprofloxacin           Current Outpatient Medications  Medication Sig Dispense Refill  . albuterol (PROVENTIL HFA;VENTOLIN HFA) 108 (90 Base) MCG/ACT inhaler Inhale into the lungs.    Marland Kitchen aspirin 81 MG tablet Take by mouth daily.    Marland Kitchen atorvastatin (LIPITOR) 80 MG tablet Take 80 mg by mouth daily.    . clopidogrel (PLAVIX) 75 MG tablet Take 75 mg by mouth daily.    . finasteride (PROSCAR) 5 MG tablet Take 5 mg by mouth daily.    . methocarbamol (ROBAXIN) 500 MG tablet Take 500 mg by mouth 4 (four) times daily.    . midodrine (PROAMATINE) 5 MG tablet Take 5 mg by mouth 3 (three) times daily with meals.    . pantoprazole (PROTONIX) 40 MG tablet Take 40 mg by mouth daily.    Marland Kitchen umeclidinium bromide (INCRUSE ELLIPTA) 62.5 MCG/INH AEPB Inhale 1 puff into the lungs daily.    . vitamin B-12 (CYANOCOBALAMIN) 500 MCG tablet Take 500 mcg by mouth daily.    . clotrimazole-betamethasone (LOTRISONE) cream Apply 1 application topically 2 (two) times daily.    . hydroxypropyl methylcellulose / hypromellose (ISOPTO TEARS / GONIOVISC) 2.5 % ophthalmic solution 1 drop.    . lactobacillus acidophilus (BACID) TABS tablet Take 2 tablets by mouth 3 (three) times daily.    . meclizine (ANTIVERT) 25 MG tablet Take 25 mg by mouth 3 (three) times daily as needed for dizziness.    . potassium citrate (UROCIT-K)  10 MEQ (1080 MG) SR tablet Take 10 mEq by mouth 3 (three) times daily with meals.     No current facility-administered medications for this visit.       REVIEW OF SYSTEMS (Negative unless checked)  Constitutional: [] Weight loss  [] Fever  [] Chills Cardiac: [] Chest pain   [] Chest pressure   [  x]Palpitations   [] Shortness of breath when laying flat   [] Shortness of breath at rest   [x] Shortness of breath with exertion. Vascular:  [] Pain in legs with walking   [] Pain in legs at rest   [] Pain in legs when laying flat   [] Claudication   [] Pain in feet when walking  [] Pain in feet at rest  [] Pain in feet when laying flat   [] History of DVT   [] Phlebitis   [x] Swelling in legs   [] Varicose veins   [] Non-healing ulcers Pulmonary:   [] Uses home oxygen   [x] Productive cough   [] Hemoptysis   [] Wheeze  [x] COPD   [] Asthma Neurologic:  [] Dizziness  [] Blackouts   [] Seizures   [x] History of stroke   [] History of TIA  [] Aphasia   [] Temporary blindness   [] Dysphagia   [] Weakness or numbness in arms   [] Weakness or numbness in legs Musculoskeletal:  [x] Arthritis   [] Joint swelling   [] Joint pain   [x] Low back pain Hematologic:  [] Easy bruising  [] Easy bleeding   [] Hypercoagulable state   [] Anemic  [] Hepatitis Gastrointestinal:  [] Blood in stool   [] Vomiting blood  [x] Gastroesophageal reflux/heartburn   [] Abdominal pain Genitourinary:  [] Chronic kidney disease   [] Difficult urination  [] Frequent urination  [] Burning with urination   [] Hematuria Skin:  [] Rashes   [] Ulcers   [] Wounds Psychological:  [] History of anxiety   []  History of major depression.    Physical Examination  BP 112/76 (BP Location: Right Arm, Patient Position: Sitting)   Pulse 77   Resp 12   Ht 5\' 11"  (1.803 m)   Wt 245 lb (111.1 kg)   BMI 34.17 kg/m  Gen:  WD/WN, NAD.  Appears younger than stated age Head: [place]/AT, No temporalis wasting. Ear/Nose/Throat: Hearing grossly intact, nares w/o erythema or drainage Eyes: Conjunctiva  clear. Sclera non-icteric Neck: Supple.  Trachea midline Pulmonary:  Good air movement, no use of accessory muscles.  Cardiac: RRR, no JVD Vascular:  Vessel Right Left  Radial Palpable Palpable                                    Musculoskeletal: M/S 5/5 throughout.  No deformity or atrophy.  No edema. Neurologic: Sensation grossly intact in extremities.  Symmetrical.  Speech is fluent.  Psychiatric: Judgment intact, Mood & affect appropriate for pt's clinical situation. Dermatologic: No rashes or ulcers noted.  No cellulitis or open wounds.       Labs No results found for this or any previous visit (from the past 2160 hour(s)).  Radiology Ct Chest Wo Contrast  Result Date: 02/11/2018 CLINICAL DATA:  Follow-up of pulmonary nodules. EXAM: CT CHEST WITHOUT CONTRAST TECHNIQUE: Multidetector CT imaging of the chest was performed following the standard protocol without IV contrast. COMPARISON:  Chest CTs dated 02/19/2017 and 08/09/2016 FINDINGS: Cardiovascular: Aortic atherosclerosis. Dilatation of the ascending thoracic aorta to a diameter of 4.2 cm, slightly increased from 4.0 cm on the prior exam. Coronary artery calcifications. Pacemaker in place. Small to moderate overall heart size remains normal. Pericardial effusion, slightly increased since the prior study. Mediastinum/Nodes: Diffuse chronic dilatation of the esophagus, unchanged. Thyroid gland and trachea appear normal. Lungs/Pleura: Several tiny bilateral pulmonary nodules are unchanged. The largest nodule at the left lung base on image 117 of series 3 measures 7 x 6 mm, unchanged. Minimal linear scarring at both lung bases, stable.  No effusions. Upper Abdomen: No acute abnormality. Musculoskeletal: No acute abnormalities.  Chronic diffuse degenerative changes in the thoracic spine. Previous anterior fusion in the lower cervical spine. IMPRESSION: 1. Multiple stable small pulmonary nodules with no new abnormalities. The nodules  have been stable for 18 months. No further follow-up recommended at this time. 2. Small to moderate pericardial effusion, slightly increased since the prior study. 3. Slight dilatation of the ascending thoracic aorta to 4.2 cm from 4.0 cm on 02/19/2017. Recommend semi-annual imaging followup by CTA or MRA and referral to cardiothoracic surgery if not already obtained. This recommendation follows 2010 ACCF/AHA/AATS/ACR/ASA/SCA/SCAI/SIR/STS/SVM Guidelines for the Diagnosis and Management of Patients With Thoracic Aortic Disease. Circulation. 2010; 121: G818-H63 Electronically Signed   By: Lorriane Shire M.D.   On: 02/11/2018 09:18   Ct Lumbar Spine Wo Contrast  Result Date: 02/11/2018 CLINICAL DATA:  Low back pain and left leg pain for 1-2 months. EXAM: CT LUMBAR SPINE WITHOUT CONTRAST TECHNIQUE: Multidetector CT imaging of the lumbar spine was performed without intravenous contrast administration. Multiplanar CT image reconstructions were also generated. COMPARISON:  None FINDINGS: Segmentation: 5 lumbar type vertebrae. Alignment: Slight lumbar scoliosis with convexity to the left centered at L2-3. Vertebrae: No acute fracture or focal pathologic process. Paraspinal and other soft tissues: Aortic atherosclerosis. Otherwise negative. Disc levels: T11-12: Disc space narrowing. No disc bulging or protrusion. T12-L1: Disc space narrowing.  No disc bulging or protrusion. L1-2: Disc space narrowing. Small broad-based disc bulge with accompanying osteophytes. This creates mild spinal stenosis. No foraminal stenosis. L2-3: Small broad-based disc bulge. Hypertrophy of the ligamentum flavum, right greater than left combine to create moderate spinal stenosis. No foraminal stenosis. L3-4: Disc degeneration with a vacuum phenomenon. Small broad-based disc bulge. Congenitally short pedicles with slight hypertrophy of the ligamentum flavum combine to create moderately severe spinal stenosis, best seen on image 75 of series 3.  L4-5: Small broad-based disc bulge slightly compresses the thecal sac without focal neural impingement. Hypertrophy of the left facet joint creates focal left foraminal stenosis with slight impingement upon the exiting L4 nerve, best seen on image 88 of series 3. L5-S1: Disc space narrowing. No disc bulging or protrusion. No foraminal stenosis. Severe left and moderately severe right facet arthritis. IMPRESSION: 1. Slight impingement upon the left L4 nerve in the neural foramen at L4-5 by a hypertrophied left facet joint. 2. Moderately severe spinal stenosis at L3-4. 3. Moderate spinal stenosis at L2-3. 4. Severe left facet arthritis at L5-S1 with moderately severe right facet arthritis at L5 S. 5.  Aortic Atherosclerosis (ICD10-I70.0). Electronically Signed   By: Lorriane Shire M.D.   On: 02/11/2018 09:05    Assessment/Plan Stroke [facility]) Does not seem to have a significant residual deficit.  Edema Controlled with elevation and compression  Hyperlipidemia lipid control important in reducing the progression of atherosclerotic disease. 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  This has not shown dramatic growth. This is well below the threshold for prophylactic repair.  He is on antiplatelet therapy and a statin agent.  Blood pressure control is important in avoiding growth of this.  This can be rechecked in 1 year with a CT scan    Leotis Pain, MD  03/01/2018 12:24 PM    This note was created with Dragon medical transcription system.  Any errors from dictation are purely unintentional

## 2018-03-05 DIAGNOSIS — M47816 Spondylosis without myelopathy or radiculopathy, lumbar region: Secondary | ICD-10-CM | POA: Insufficient documentation

## 2018-06-05 ENCOUNTER — Other Ambulatory Visit: Payer: Self-pay | Admitting: Family Medicine

## 2018-06-06 ENCOUNTER — Other Ambulatory Visit: Payer: Self-pay | Admitting: Family Medicine

## 2018-06-06 DIAGNOSIS — R569 Unspecified convulsions: Secondary | ICD-10-CM

## 2018-06-14 ENCOUNTER — Ambulatory Visit
Admission: RE | Admit: 2018-06-14 | Discharge: 2018-06-14 | Disposition: A | Discharge status: Home / Self Care | Payer: Medicare HMO | Source: Ambulatory Visit | Attending: Family Medicine | Admitting: Family Medicine

## 2018-06-14 DIAGNOSIS — R569 Unspecified convulsions: Secondary | ICD-10-CM | POA: Diagnosis present

## 2018-06-14 MED ORDER — IOPAMIDOL (ISOVUE-300) INJECTION 61%
75.0000 mL | Freq: Once | INTRAVENOUS | Status: AC | PRN
  Administered 2018-06-14: 75 mL via INTRAVENOUS

## 2018-06-19 ENCOUNTER — Other Ambulatory Visit: Payer: Self-pay

## 2018-06-19 ENCOUNTER — Encounter: Payer: Self-pay | Admitting: Emergency Medicine

## 2018-06-19 ENCOUNTER — Observation Stay
Admission: EM | Admit: 2018-06-19 | Discharge: 2018-06-21 | Disposition: A | Discharge status: Home / Self Care | Payer: Medicare HMO | Attending: Internal Medicine | Admitting: Internal Medicine

## 2018-06-19 ENCOUNTER — Emergency Department: Payer: Medicare HMO

## 2018-06-19 DIAGNOSIS — R55 Syncope and collapse: Secondary | ICD-10-CM | POA: Diagnosis present

## 2018-06-19 DIAGNOSIS — M349 Systemic sclerosis, unspecified: Secondary | ICD-10-CM | POA: Insufficient documentation

## 2018-06-19 DIAGNOSIS — Z87442 Personal history of urinary calculi: Secondary | ICD-10-CM | POA: Insufficient documentation

## 2018-06-19 DIAGNOSIS — G5600 Carpal tunnel syndrome, unspecified upper limb: Secondary | ICD-10-CM | POA: Insufficient documentation

## 2018-06-19 DIAGNOSIS — Z950 Presence of cardiac pacemaker: Secondary | ICD-10-CM | POA: Diagnosis not present

## 2018-06-19 DIAGNOSIS — Z79899 Other long term (current) drug therapy: Secondary | ICD-10-CM | POA: Diagnosis not present

## 2018-06-19 DIAGNOSIS — R7303 Prediabetes: Secondary | ICD-10-CM | POA: Insufficient documentation

## 2018-06-19 DIAGNOSIS — Z8673 Personal history of transient ischemic attack (TIA), and cerebral infarction without residual deficits: Secondary | ICD-10-CM | POA: Diagnosis not present

## 2018-06-19 DIAGNOSIS — E538 Deficiency of other specified B group vitamins: Secondary | ICD-10-CM | POA: Diagnosis not present

## 2018-06-19 DIAGNOSIS — N138 Other obstructive and reflux uropathy: Secondary | ICD-10-CM | POA: Diagnosis not present

## 2018-06-19 DIAGNOSIS — E785 Hyperlipidemia, unspecified: Secondary | ICD-10-CM | POA: Insufficient documentation

## 2018-06-19 DIAGNOSIS — Z7982 Long term (current) use of aspirin: Secondary | ICD-10-CM | POA: Insufficient documentation

## 2018-06-19 DIAGNOSIS — G4733 Obstructive sleep apnea (adult) (pediatric): Secondary | ICD-10-CM | POA: Diagnosis not present

## 2018-06-19 DIAGNOSIS — R0902 Hypoxemia: Secondary | ICD-10-CM | POA: Diagnosis not present

## 2018-06-19 DIAGNOSIS — G629 Polyneuropathy, unspecified: Secondary | ICD-10-CM | POA: Insufficient documentation

## 2018-06-19 DIAGNOSIS — I4510 Unspecified right bundle-branch block: Secondary | ICD-10-CM | POA: Insufficient documentation

## 2018-06-19 DIAGNOSIS — R609 Edema, unspecified: Secondary | ICD-10-CM

## 2018-06-19 DIAGNOSIS — N401 Enlarged prostate with lower urinary tract symptoms: Secondary | ICD-10-CM | POA: Insufficient documentation

## 2018-06-19 DIAGNOSIS — J449 Chronic obstructive pulmonary disease, unspecified: Secondary | ICD-10-CM | POA: Insufficient documentation

## 2018-06-19 DIAGNOSIS — K219 Gastro-esophageal reflux disease without esophagitis: Secondary | ICD-10-CM | POA: Diagnosis not present

## 2018-06-19 DIAGNOSIS — Z66 Do not resuscitate: Secondary | ICD-10-CM | POA: Insufficient documentation

## 2018-06-19 DIAGNOSIS — K5790 Diverticulosis of intestine, part unspecified, without perforation or abscess without bleeding: Secondary | ICD-10-CM | POA: Insufficient documentation

## 2018-06-19 LAB — CBC
HCT: 48.1 % (ref 39.0–52.0)
HEMOGLOBIN: 15.2 g/dL (ref 13.0–17.0)
MCH: 29.5 pg (ref 26.0–34.0)
MCHC: 31.6 g/dL (ref 30.0–36.0)
MCV: 93.2 fL (ref 80.0–100.0)
Platelets: 346 10*3/uL (ref 150–400)
RBC: 5.16 MIL/uL (ref 4.22–5.81)
RDW: 14 % (ref 11.5–15.5)
WBC: 13 10*3/uL — ABNORMAL HIGH (ref 4.0–10.5)
nRBC: 0 % (ref 0.0–0.2)

## 2018-06-19 LAB — BASIC METABOLIC PANEL
Anion gap: 9 (ref 5–15)
BUN: 17 mg/dL (ref 8–23)
CO2: 22 mmol/L (ref 22–32)
Calcium: 9.2 mg/dL (ref 8.9–10.3)
Chloride: 108 mmol/L (ref 98–111)
Creatinine, Ser: 1.05 mg/dL (ref 0.61–1.24)
GFR calc Af Amer: >60 mL/min (ref >60)
GFR calc non Af Amer: >60 mL/min (ref >60)
Glucose, Bld: 102 mg/dL — ABNORMAL HIGH (ref 70–99)
Potassium: 3.9 mmol/L (ref 3.5–5.1)
SODIUM: 139 mmol/L (ref 135–145)

## 2018-06-19 LAB — TROPONIN I: Troponin I: <0.03 ng/mL (ref <0.03)

## 2018-06-19 LAB — BRAIN NATRIURETIC PEPTIDE: B NATRIURETIC PEPTIDE 5: 98 pg/mL (ref 0.0–100.0)

## 2018-06-19 MED ORDER — NORTRIPTYLINE HCL 10 MG PO CAPS
10.0000 mg | ORAL_CAPSULE | Freq: Every day | ORAL | Status: DC
  Administered 2018-06-20 (×2): 10 mg via ORAL
  Filled 2018-06-19 (×3): qty 1

## 2018-06-19 MED ORDER — ASPIRIN EC 81 MG PO TBEC
81.0000 mg | DELAYED_RELEASE_TABLET | Freq: Every day | ORAL | Status: DC
  Administered 2018-06-20 – 2018-06-21 (×2): 81 mg via ORAL
  Filled 2018-06-19 (×3): qty 1

## 2018-06-19 MED ORDER — ATORVASTATIN CALCIUM 20 MG PO TABS: 80.0000 mg | ORAL_TABLET | Freq: Every day | ORAL | Status: DC

## 2018-06-19 MED ORDER — FINASTERIDE 5 MG PO TABS
5.0000 mg | ORAL_TABLET | Freq: Every day | ORAL | Status: DC
  Administered 2018-06-20 – 2018-06-21 (×2): 5 mg via ORAL
  Filled 2018-06-19 (×2): qty 1

## 2018-06-19 MED ORDER — VITAMIN B-12 1000 MCG PO TABS
500.0000 ug | ORAL_TABLET | Freq: Every day | ORAL | Status: DC
  Administered 2018-06-20 – 2018-06-21 (×2): 500 ug via ORAL
  Filled 2018-06-19 (×2): qty 1

## 2018-06-19 MED ORDER — PANTOPRAZOLE SODIUM 40 MG PO TBEC
40.0000 mg | DELAYED_RELEASE_TABLET | Freq: Every day | ORAL | Status: DC
  Administered 2018-06-20 – 2018-06-21 (×2): 40 mg via ORAL
  Filled 2018-06-19 (×2): qty 1

## 2018-06-19 MED ORDER — SODIUM CHLORIDE 0.9 % IV SOLN
INTRAVENOUS | Status: DC
  Administered 2018-06-20 (×3): via INTRAVENOUS

## 2018-06-19 MED ORDER — MECLIZINE HCL 25 MG PO TABS
25.0000 mg | ORAL_TABLET | Freq: Three times a day (TID) | ORAL | Status: DC | PRN
  Filled 2018-06-19: qty 1

## 2018-06-19 MED ORDER — ENOXAPARIN SODIUM 40 MG/0.4ML ~~LOC~~ SOLN
40.0000 mg | SUBCUTANEOUS | Status: DC
  Administered 2018-06-20: 40 mg via SUBCUTANEOUS
  Filled 2018-06-19: qty 0.4

## 2018-06-19 MED ORDER — UMECLIDINIUM-VILANTEROL 62.5-25 MCG/INH IN AEPB
1.0000 | INHALATION_SPRAY | Freq: Every day | RESPIRATORY_TRACT | Status: DC
  Administered 2018-06-20 – 2018-06-21 (×2): 1 via RESPIRATORY_TRACT
  Filled 2018-06-19: qty 14

## 2018-06-19 MED ORDER — ONDANSETRON HCL 4 MG/2ML IJ SOLN: 4.0000 mg | Freq: Four times a day (QID) | INTRAMUSCULAR | Status: DC | PRN

## 2018-06-19 MED ORDER — ACETAMINOPHEN 650 MG RE SUPP: 650.0000 mg | Freq: Four times a day (QID) | RECTAL | Status: DC | PRN

## 2018-06-19 MED ORDER — SENNOSIDES-DOCUSATE SODIUM 8.6-50 MG PO TABS: 1.0000 | ORAL_TABLET | Freq: Every evening | ORAL | Status: DC | PRN

## 2018-06-19 MED ORDER — ONDANSETRON HCL 4 MG PO TABS: 4.0000 mg | ORAL_TABLET | Freq: Four times a day (QID) | ORAL | Status: DC | PRN

## 2018-06-19 MED ORDER — ACETAMINOPHEN 325 MG PO TABS: 650.0000 mg | ORAL_TABLET | Freq: Four times a day (QID) | ORAL | Status: DC | PRN

## 2018-06-19 MED ORDER — ATORVASTATIN CALCIUM 20 MG PO TABS
80.0000 mg | ORAL_TABLET | Freq: Every day | ORAL | Status: DC
  Administered 2018-06-20 (×2): 80 mg via ORAL
  Filled 2018-06-19 (×2): qty 4

## 2018-06-19 MED ORDER — POTASSIUM CITRATE ER 10 MEQ (1080 MG) PO TBCR
10.0000 meq | EXTENDED_RELEASE_TABLET | Freq: Three times a day (TID) | ORAL | Status: DC
  Filled 2018-06-19: qty 1

## 2018-06-19 MED ORDER — ALBUTEROL SULFATE (2.5 MG/3ML) 0.083% IN NEBU: 2.5000 mg | INHALATION_SOLUTION | Freq: Four times a day (QID) | RESPIRATORY_TRACT | Status: DC | PRN

## 2018-06-19 MED ORDER — CLOPIDOGREL BISULFATE 75 MG PO TABS
75.0000 mg | ORAL_TABLET | Freq: Every day | ORAL | Status: DC
  Administered 2018-06-20 – 2018-06-21 (×2): 75 mg via ORAL
  Filled 2018-06-19 (×2): qty 1

## 2018-06-19 MED ORDER — ASPIRIN EC 81 MG PO TBEC: 81.0000 mg | DELAYED_RELEASE_TABLET | Freq: Every day | ORAL | Status: DC

## 2018-06-19 MED ORDER — MIDODRINE HCL 5 MG PO TABS
5.0000 mg | ORAL_TABLET | Freq: Three times a day (TID) | ORAL | Status: DC
  Administered 2018-06-20 – 2018-06-21 (×3): 5 mg via ORAL
  Filled 2018-06-19 (×5): qty 1

## 2018-06-19 NOTE — ED Notes (Signed)
2 unsuccessful attempts at drawing pt's blood, lab called for collection

## 2018-06-19 NOTE — ED Triage Notes (Signed)
Pt was at a scheduled EEG due to weakness/head fullness episodes. Pt was unable to get an EEG performed due to feelings of weakness and a feeling short of breath. Per family report pt's lips and fingers were discolored (blue) and oxygen saturation in the 80s prior to arrival in ED. Pt in NAD in triage. Breathing equal and unlabored. No discoloration noted.

## 2018-06-19 NOTE — Progress Notes (Addendum)
Advanced care plan.  Purpose of the Encounter: CODE STATUS  Parties in Attendance: Patient and family  Patient's Decision Capacity: Good  Subjective/Patient's story: Presented to the emergency room from imaging center where he was getting EEG after he had an episode of hypoxia   Objective/Medical story Patient had a spell of hypoxia and cyanosis but the spells are happening quite frequently He was scheduled for an EEG Procedure could not be done Needs further evaluation   Goals of care determination:  Advance care directives goals of care and treatment plan discussed Patient does not want CPR, intubation and ventilator if the need arises Patient is DNR   CODE STATUS: DNR   Time spent discussing advanced care planning: 16 minutes

## 2018-06-19 NOTE — ED Notes (Signed)
Attempted to call report. RN unable to take the call at this time.

## 2018-06-19 NOTE — ED Provider Notes (Signed)
[facility] Emergency Department Provider Note ____________________________________________   First MD Initiated Contact with Patient 06/19/18 1921     (approximate)  I have reviewed the triage vital signs and the nursing notes.   HISTORY  Chief Complaint Shortness of Breath    HPI Jay Watkins is a 77 y.o. male with PMH as noted below who presents with near syncopal type events associated with hypoxia and cyanosis, occurring intermittently for approximate last 6 months, but occurring with more frequency recently and up to several times per day in the last few days.  The patient had a CT head as an outpatient and was scheduled for an EEG today.  When he was at the office for the EEG, he had 1 of the episodes and was noted to be cyanotic.  Per the wife, O2 saturation at that time was noted to be in the low 80s.  The patient states that during these episodes he feels "lousy" and weak.  He feels like if he does not sit down he will pass out.  He states that he does feel somewhat short of breath during them.  He states that they last from several minutes up to a few hours.  He states that he was feeling this way in the waiting room although his symptoms from this most recent episode have now resolved.  Past Medical History:  Diagnosis Date  . Asbestos exposure   . B12 deficiency   . BPH with urinary obstruction    lower urinary tract symptoms  . Carpal tunnel syndrome   . COPD, moderate ([place])   . Difficulty walking   . Diverticulosis   . Dysphagia   . Dyspnea    unspecified  . Edema   . Elevated PSA   . GERD (gastroesophageal reflux disease)   . Gouty arthropathy   . Hyperlipidemia   . Hypotension   . Memory loss   . Neuropathy    idopathic peripheral  . Onychomycosis   . Orthostatic hypotension   . Patent foramen ovale   . Pre-diabetes   . Presence of permanent cardiac pacemaker   . Proteinuria   . Renal calculus   . 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  Scleroderma ([place])   . Sleep apnea   . Spondylosis of cervical spine   . Stasis dermatitis   . Stroke ([place])   . 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 Urinary frequency   . Varicose veins of leg with edema     Patient Active Problem List   Diagnosis Date Noted  . Stroke ([place]) 08/24/2017  . Edema 08/24/2017  . Hyperlipidemia 08/24/2017  . Thoracic aortic aneurysm without rupture ([place]) 08/24/2017    Past Surgical History:  Procedure Laterality Date  . ANKLE FUSION Left   . COLONOSCOPY WITH PROPOFOL N/A 12/29/2016   Procedure: COLONOSCOPY WITH PROPOFOL;  Surgeon: Lollie Sails, MD;  Location: [facility] ENDOSCOPY;  Service: Endoscopy;  Laterality: N/A;  . COLONOSCOPY WITH PROPOFOL N/A 01/16/2017   Procedure: COLONOSCOPY WITH PROPOFOL;  Surgeon: Lollie Sails, MD;  Location: [facility] ENDOSCOPY;  Service: Endoscopy;  Laterality: N/A;  . ESOPHAGOGASTRODUODENOSCOPY (EGD) WITH PROPOFOL N/A 12/29/2016   Procedure: ESOPHAGOGASTRODUODENOSCOPY (EGD) WITH PROPOFOL;  Surgeon: Lollie Sails, MD;  Location: [facility] ENDOSCOPY;  Service: Endoscopy;  Laterality: N/A;  . HEMORRHOID SURGERY    . INSERT / REPLACE / REMOVE PACEMAKER    . neck fusion     cervical spine  Prior to Admission medications   Medication Sig Start Date End Date Taking? Authorizing Provider  albuterol (PROVENTIL HFA;VENTOLIN HFA) 108 (90 Base) MCG/ACT inhaler Inhale 2 puffs into the lungs every 6 (six) hours as needed.    Yes [provider]  aspirin 81 MG tablet Take 81 mg by mouth daily.    Yes [provider]  atorvastatin (LIPITOR) 80 MG tablet Take 80 mg by mouth daily.   Yes [provider]  clopidogrel (PLAVIX) 75 MG tablet Take 75 mg by mouth daily.   Yes [provider]  finasteride (PROSCAR) 5 MG tablet Take 5 mg by mouth daily.   Yes [provider]  meclizine (ANTIVERT) 25 MG tablet Take 25 mg by mouth 3 (three) times daily as needed for dizziness.   Yes [provider]  midodrine (PROAMATINE) 5 MG tablet Take 5 mg by mouth 3 (three) times daily with meals.   Yes [provider]  pantoprazole (PROTONIX) 40 MG tablet Take 40 mg by mouth daily.   Yes [provider]  potassium citrate (UROCIT-K) 10 MEQ (1080 MG) SR tablet Take 10 mEq by mouth 3 (three) times daily with meals.   Yes [provider]  tizanidine (ZANAFLEX) 2 MG capsule Take 2 mg by mouth 3 (three) times daily as needed.  11/18/17  Yes [provider]  umeclidinium-vilanterol (ANORO ELLIPTA) 62.5-25 MCG/INH AEPB Inhale 1 puff into the lungs daily.   Yes [provider]  vitamin B-12 (CYANOCOBALAMIN) 500 MCG tablet Take 500 mcg by mouth daily.   Yes [provider]    Allergies Cellcept [mycophenolate]; Vioxx [rofecoxib]; and Ciprofloxacin  Family History  Problem Relation Age of Onset  . Pancreatic cancer Mother   . Cancer Sister   . Alzheimer's disease Sister   . ALS Sister   . Lung cancer Brother   . Parkinsonism Brother     Social History Social History   Tobacco Use  . Smoking status: Never Smoker  . Smokeless tobacco: Never Used  Substance Use Topics  . Alcohol use: No  . Drug use: No    Review of Systems  Constitutional: No fever. Eyes: No visual changes. ENT: No neck pain. Cardiovascular: Denies chest pain. Respiratory: Positive for intermittent shortness of breath. Gastrointestinal: No vomiting or diarrhea.  Genitourinary: Negative for dysuria.  Musculoskeletal: Negative for back pain. Skin: Negative for rash. Neurological: Negative for headache.  ____________________________________________   PHYSICAL EXAM:  VITAL SIGNS: ED Triage Vitals  Enc Vitals Group     BP 06/19/18 1653 130/74     Pulse Rate 06/19/18 1704 85     Resp 06/19/18 1653 (!) 22     Temp 06/19/18 1653 97.6 F (36.4 C)     Temp Source 06/19/18 1653 Oral     SpO2 06/19/18 1653 97 %     Weight 06/19/18 1654 240 lb (108.9  kg)     Height 06/19/18 1654 5\' 9"  (1.753 m)     Head Circumference --      Peak Flow --      Pain Score 06/19/18 1704 0     Pain Loc --      Pain Edu? --      Excl. in [place]? --     Constitutional: Alert and oriented.  Relatively well appearing and in no acute distress. Eyes: Conjunctivae are normal.  No pallor.  EOMI.  PERRLA. Head: Atraumatic. Nose: No congestion/rhinnorhea. Mouth/Throat: Mucous membranes are slightly dry.   Neck:  Normal range of motion.  Cardiovascular: Normal rate, regular rhythm. Grossly normal heart sounds.  Good peripheral circulation. Respiratory: Normal respiratory effort.  No retractions. Lungs CTAB. Gastrointestinal: No distention.  Musculoskeletal: No lower extremity edema.  Extremities warm and well perfused.  Neurologic:  Normal speech and language.  Motor intact in all extremities.  Normal coordination.  No gross focal neurologic deficits are appreciated.  Skin:  Skin is warm and dry. No rash noted. Psychiatric: Mood and affect are normal. Speech and behavior are normal.  ____________________________________________   LABS (all labs ordered are listed, but only abnormal results are displayed)  Labs Reviewed  CBC - Abnormal; Notable for the following components:      Result Value   WBC 13.0 (*)    All other components within normal limits  TROPONIN I  BRAIN NATRIURETIC PEPTIDE  BASIC METABOLIC PANEL   ____________________________________________  EKG  ED ECG REPORT I, Arta Silence, the attending physician, personally viewed and interpreted this ECG.  Date: 06/19/2018 EKG Time: 1659 Rate: 85 Rhythm: normal sinus rhythm QRS Axis: normal Intervals: RBBB ST/T Wave abnormalities: normal Narrative Interpretation: no evidence of acute ischemia; no significant change when compared to EKG of 02/07/2017  ____________________________________________  RADIOLOGY  CXR: No focal infiltrate or other acute  abnormality  ____________________________________________   PROCEDURES  Procedure(s) performed: No  Procedures  Critical Care performed: No ____________________________________________   INITIAL IMPRESSION / ASSESSMENT AND PLAN / ED COURSE  Pertinent labs & imaging results that were available during my care of the patient were reviewed by me and considered in my medical decision making (see chart for details).  77 year old male with PMH as noted above including COPD, hypertension, and CVA presents with intermittent near syncopal type episodes which have been occurring over the last several months but more frequently in the last few days and associated with shortness of breath, cyanosis and observed hypoxia today while the patient was about to get an EEG.  On exam currently the patient appears relatively well and his vital signs are normal.  He has no hypoxia in the ED.  Neuro exam is nonfocal and his lungs are clear.  The remainder of the exam is unremarkable.  However the patient states that the symptoms he was having resolved while he was waiting to be seen.  Initial chest x-ray, EKG and labs are unremarkable.  Although the work-up does not demonstrate an obvious cause of the symptoms, given the documented hypoxia today, the patient's age and comorbidities, and the fact that the episodes seem to be happening more frequently, I think he would benefit from observation overnight and possible EEG.  ----------------------------------------- 9:06 PM on 06/19/2018 -----------------------------------------  I signed the patient out to the hospitalist Dr. Estanislado Pandy.  ____________________________________________   FINAL CLINICAL IMPRESSION(S) / ED DIAGNOSES  Final diagnoses:  Near syncope  Hypoxia      NEW MEDICATIONS STARTED DURING THIS VISIT:  New Prescriptions   No medications on file     Note:  This document was prepared using Dragon voice recognition software and may  include unintentional dictation errors.    Arta Silence, MD 06/19/18 2106

## 2018-06-19 NOTE — H&P (Signed)
[facility] [place] NAME: Jay Watkins    MR#:  [id number]  DATE OF BIRTH:  1940-11-09  DATE OF ADMISSION:  06/19/2018  PRIMARY CARE PHYSICIAN: Dion Body, MD   REQUESTING/REFERRING PHYSICIAN:   CHIEF COMPLAINT:   Chief Complaint  Patient presents with  . Shortness of Breath    HISTORY OF PRESENT ILLNESS: Jay Watkins  is a 77 y.o. male with a known history of COPD, carpal tunnel syndrome, diverticulosis, dysphagia, GERD, hyperlipidemia, neuropathy had a near syncopal event which was associated with hypoxia and cyanosis when he was having EEG today.  He recently had a CT head as an outpatient which did not show any acute abnormality.  Patient was scheduled for EEG today but could not be done because he had an episode of hypoxia and O2 sats were around 80% he was cyanotic.  His oxygen saturation has improved.  No complaints of any shortness of breath, cough.  Felt lousy for the whole week.  He was evaluated in the emergency room.  Chest x-ray did not reveal any pneumonia.  Hospitalist service was consulted.  PAST MEDICAL HISTORY:   Past Medical History:  Diagnosis Date  . Asbestos exposure   . B12 deficiency   . BPH with urinary obstruction    lower urinary tract symptoms  . Carpal tunnel syndrome   . COPD, moderate ([place])   . Difficulty walking   . Diverticulosis   . Dysphagia   . Dyspnea    unspecified  . Edema   . Elevated PSA   . GERD (gastroesophageal reflux disease)   . Gouty arthropathy   . Hyperlipidemia   . Hypotension   . Memory loss   . Neuropathy    idopathic peripheral  . Onychomycosis   . Orthostatic hypotension   . Patent foramen ovale   . Pre-diabetes   . Presence of permanent cardiac pacemaker   . Proteinuria   . Renal calculus   . 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 Scleroderma ([place])   . Sleep apnea   . Spondylosis of cervical spine   . Stasis dermatitis   . Stroke ([place])   . Ulcer of lower limb  (HCC)    chronic  . Urinary frequency   . Varicose veins of leg with edema     PAST SURGICAL HISTORY:  Past Surgical History:  Procedure Laterality Date  . ANKLE FUSION Left   . COLONOSCOPY WITH PROPOFOL N/A 12/29/2016   Procedure: COLONOSCOPY WITH PROPOFOL;  Surgeon: Lollie Sails, MD;  Location: [facility] ENDOSCOPY;  Service: Endoscopy;  Laterality: N/A;  . COLONOSCOPY WITH PROPOFOL N/A 01/16/2017   Procedure: COLONOSCOPY WITH PROPOFOL;  Surgeon: Lollie Sails, MD;  Location: [facility] ENDOSCOPY;  Service: Endoscopy;  Laterality: N/A;  . ESOPHAGOGASTRODUODENOSCOPY (EGD) WITH PROPOFOL N/A 12/29/2016   Procedure: ESOPHAGOGASTRODUODENOSCOPY (EGD) WITH PROPOFOL;  Surgeon: Lollie Sails, MD;  Location: [facility] ENDOSCOPY;  Service: Endoscopy;  Laterality: N/A;  . HEMORRHOID SURGERY    . INSERT / REPLACE / REMOVE PACEMAKER    . neck fusion     cervical spine    SOCIAL HISTORY:  Social History   Tobacco Use  . Smoking status: Never Smoker  . Smokeless tobacco: Never Used  Substance Use Topics  . Alcohol use: No    FAMILY HISTORY:  Family History  Problem Relation Age of Onset  . Pancreatic cancer Mother   . Cancer Sister   . Alzheimer's disease Sister   .  ALS Sister   . Lung cancer Brother   . Parkinsonism Brother     DRUG ALLERGIES:  Allergies  Allergen Reactions  . Cellcept [Mycophenolate] Swelling  . Vioxx [Rofecoxib]   . Ciprofloxacin     REVIEW OF SYSTEMS:   CONSTITUTIONAL: No fever,has fatigue and weakness.  EYES: No blurred or double vision.  EARS, NOSE, AND THROAT: No tinnitus or ear pain.  RESPIRATORY: No cough, shortness of breath, wheezing or hemoptysis.  CARDIOVASCULAR: No chest pain, orthopnea, edema.  GASTROINTESTINAL: No nausea, vomiting, diarrhea or abdominal pain.  GENITOURINARY: No dysuria, hematuria.  ENDOCRINE: No polyuria, nocturia,  HEMATOLOGY: No anemia, easy bruising or bleeding SKIN: No rash or lesion. MUSCULOSKELETAL: No joint pain or  arthritis.   NEUROLOGIC: No tingling, numbness, weakness.  PSYCHIATRY: No anxiety or depression.   MEDICATIONS AT HOME:  Prior to Admission medications   Medication Sig Start Date End Date Taking? Authorizing Provider  albuterol (PROVENTIL HFA;VENTOLIN HFA) 108 (90 Base) MCG/ACT inhaler Inhale 2 puffs into the lungs every 6 (six) hours as needed.    Yes [provider]  aspirin 81 MG tablet Take 81 mg by mouth daily.    Yes [provider]  atorvastatin (LIPITOR) 80 MG tablet Take 80 mg by mouth daily.   Yes [provider]  clopidogrel (PLAVIX) 75 MG tablet Take 75 mg by mouth daily.   Yes [provider]  finasteride (PROSCAR) 5 MG tablet Take 5 mg by mouth daily.   Yes [provider]  meclizine (ANTIVERT) 25 MG tablet Take 25 mg by mouth 3 (three) times daily as needed for dizziness.   Yes [provider]  midodrine (PROAMATINE) 5 MG tablet Take 5 mg by mouth 3 (three) times daily with meals.   Yes [provider]  pantoprazole (PROTONIX) 40 MG tablet Take 40 mg by mouth daily.   Yes [provider]  potassium citrate (UROCIT-K) 10 MEQ (1080 MG) SR tablet Take 10 mEq by mouth 3 (three) times daily with meals.   Yes [provider]  tizanidine (ZANAFLEX) 2 MG capsule Take 2 mg by mouth 3 (three) times daily as needed.  11/18/17  Yes [provider]  umeclidinium-vilanterol (ANORO ELLIPTA) 62.5-25 MCG/INH AEPB Inhale 1 puff into the lungs daily.   Yes [provider]  vitamin B-12 (CYANOCOBALAMIN) 500 MCG tablet Take 500 mcg by mouth daily.   Yes [provider]      PHYSICAL EXAMINATION:   VITAL SIGNS: Blood pressure 132/71, pulse 64, temperature 97.6 F (36.4 C), temperature source Oral, resp. rate 16, height 5\' 9"  (1.753 m), weight 108.9 kg, SpO2 100 %.  GENERAL:  77 y.o.-year-old patient lying in the bed with no acute distress.  EYES: Pupils equal, round, reactive to light and  accommodation. No scleral icterus. Extraocular muscles intact.  HEENT: Head atraumatic, normocephalic. Oropharynx and nasopharynx clear.  NECK:  Supple, no jugular venous distention. No thyroid enlargement, no tenderness.  LUNGS: Normal breath sounds bilaterally, no wheezing, rales,rhonchi or crepitation. No use of accessory muscles of respiration.  CARDIOVASCULAR: S1, S2 normal. No murmurs, rubs, or gallops.  ABDOMEN: Soft, nontender, nondistended. Bowel sounds present. No organomegaly or mass.  EXTREMITIES: No pedal edema, cyanosis, or clubbing.  NEUROLOGIC: Cranial nerves II through XII are intact. Muscle strength 5/5 in all extremities. Sensation intact. Gait not checked.  PSYCHIATRIC: The patient is alert and oriented x 3.  SKIN: No obvious rash, lesion, or ulcer.   LABORATORY PANEL:   CBC  Recent Labs  Lab 06/19/18 1730  WBC 13.0*  HGB 15.2  HCT 48.1  PLT 346  MCV 93.2  MCH 29.5  MCHC 31.6  RDW 14.0   ------------------------------------------------------------------------------------------------------------------  Chemistries  No results for input(s): NA, K, CL, CO2, GLUCOSE, BUN, CREATININE, CALCIUM, MG, AST, ALT, ALKPHOS, BILITOT in the last 168 hours.  Invalid input(s): GFRCGP ------------------------------------------------------------------------------------------------------------------ CrCl cannot be calculated (Patient's most recent lab result is older than the maximum 21 days allowed.). ------------------------------------------------------------------------------------------------------------------ No results for input(s): TSH, T4TOTAL, T3FREE, THYROIDAB in the last 72 hours.  Invalid input(s): FREET3   Coagulation profile No results for input(s): INR, PROTIME in the last 168 hours. ------------------------------------------------------------------------------------------------------------------- No results for input(s): DDIMER in the last 72  hours. -------------------------------------------------------------------------------------------------------------------  Cardiac Enzymes Recent Labs  Lab 06/19/18 1730  TROPONINI <0.03   ------------------------------------------------------------------------------------------------------------------ Invalid input(s): POCBNP  ---------------------------------------------------------------------------------------------------------------  Urinalysis    Component Value Date/Time   COLORURINE AMBER (A) 02/07/2017 1352   APPEARANCEUR HAZY (A) 02/07/2017 1352   LABSPEC 1.023 02/07/2017 1352   PHURINE 5.0 02/07/2017 1352   GLUCOSEU NEGATIVE 02/07/2017 1352   HGBUR NEGATIVE 02/07/2017 1352   BILIRUBINUR NEGATIVE 02/07/2017 1352   KETONESUR NEGATIVE 02/07/2017 1352   PROTEINUR 30 (A) 02/07/2017 1352   NITRITE NEGATIVE 02/07/2017 1352   LEUKOCYTESUR NEGATIVE 02/07/2017 1352     RADIOLOGY: Dg Chest 2 View  Result Date: 06/19/2018 CLINICAL DATA:  Shortness of breath. 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 Surgical plate in the lower cervical spine. Both lungs are clear. Negative for a pneumothorax. Heart size is within normal limits and stable. Atherosclerotic calcifications at the aortic arch. Multilevel degenerative disease in the lower thoracic spine. No large pleural effusions. IMPRESSION: No active cardiopulmonary disease. Electronically Signed   By: Markus Daft M.D.   On: 06/19/2018 18:29    EKG: Orders placed or performed during the hospital encounter of 06/19/18  . EKG 12-Lead  . EKG 12-Lead  . ED EKG within 10 minutes  . ED EKG within 10 minutes    IMPRESSION AND PLAN:  77 year old male patient with history ofCOPD, carpal tunnel syndrome, diverticulosis, dysphagia, GERD, hyperlipidemia, neuropathy had a near syncopal event which was associated with hypoxia and cyanosis when he was having EEG  today.   -Near-syncope Admit patient to telemetry observation bed IV fluid hydration Check orthostatics Cycle troponin Cardiac monitoring  -Hypoxic spells Unknown etiology Check EEG as outpatient  -GERD Continue PPI  -Hyperlipidemia Continue statin medication  -DVT prophylaxis subcu Lovenox daily  All the records are reviewed and case discussed with ED provider. Management plans discussed with the patient, family and they are in agreement.  CODE STATUS DNR Advance Directive Documentation     Most Recent Value  Type of Advance Directive  Living will, Healthcare Power of Attorney  Pre-existing out of facility DNR order (yellow form or pink MOST form)  -  "MOST" Form in Place?  -       TOTAL TIME TAKING CARE OF THIS PATIENT: 53 minutes.    Saundra Shelling M.D on 06/19/2018 at 9:23 PM  Between 7am to 6pm - Pager - [phone]  After 6pm go to [email or web address] - password [email or web address]  Office  [phone]  CC: Primary care physician; Dion Body, MD

## 2018-06-20 ENCOUNTER — Observation Stay: Payer: Medicare HMO

## 2018-06-20 DIAGNOSIS — R55 Syncope and collapse: Secondary | ICD-10-CM | POA: Diagnosis not present

## 2018-06-20 LAB — CBC
HCT: 42 % (ref 39.0–52.0)
Hemoglobin: 13 g/dL (ref 13.0–17.0)
MCH: 29.3 pg (ref 26.0–34.0)
MCHC: 31 g/dL (ref 30.0–36.0)
MCV: 94.6 fL (ref 80.0–100.0)
Platelets: 329 10*3/uL (ref 150–400)
RBC: 4.44 MIL/uL (ref 4.22–5.81)
RDW: 13.8 % (ref 11.5–15.5)
WBC: 8.6 10*3/uL (ref 4.0–10.5)
nRBC: 0 % (ref 0.0–0.2)

## 2018-06-20 LAB — BASIC METABOLIC PANEL
Anion gap: 6 (ref 5–15)
BUN: 17 mg/dL (ref 8–23)
CO2: 25 mmol/L (ref 22–32)
Calcium: 9.1 mg/dL (ref 8.9–10.3)
Chloride: 108 mmol/L (ref 98–111)
Creatinine, Ser: 1.04 mg/dL (ref 0.61–1.24)
GFR calc Af Amer: >60 mL/min (ref >60)
Glucose, Bld: 111 mg/dL — ABNORMAL HIGH (ref 70–99)
Potassium: 3.6 mmol/L (ref 3.5–5.1)
Sodium: 139 mmol/L (ref 135–145)

## 2018-06-20 LAB — TROPONIN I
Troponin I: <0.03 ng/mL (ref <0.03)
Troponin I: <0.03 ng/mL (ref <0.03)
Troponin I: <0.03 ng/mL (ref <0.03)

## 2018-06-20 MED ORDER — ENOXAPARIN SODIUM 40 MG/0.4ML ~~LOC~~ SOLN
40.0000 mg | Freq: Two times a day (BID) | SUBCUTANEOUS | Status: DC
  Administered 2018-06-20: 40 mg via SUBCUTANEOUS
  Filled 2018-06-20: qty 0.4

## 2018-06-20 NOTE — Progress Notes (Signed)
eeg completed ° °

## 2018-06-20 NOTE — Progress Notes (Signed)
Patient refused cpap for the night. No distress noted at this time

## 2018-06-20 NOTE — Procedures (Signed)
History: 77 year old male being evaluated for dizziness  Sedation: None  Technique: This is a 21 channel routine scalp EEG performed at the bedside with bipolar and monopolar montages arranged in accordance to the international 10/20 system of electrode placement. One channel was dedicated to EKG recording.    Background: The background consists of intermixed alpha and beta activities. There is a well defined posterior dominant rhythm of 9 Hz that attenuates with eye opening. Sleep is recorded with normal appearing structures.   Photic stimulation: Physiologic driving is present  EEG Abnormalities: None  Clinical Interpretation: This normal EEG is recorded in the waking and sleep state. There was no seizure or seizure predisposition recorded on this study. Please note that lack of epileptiform activity on EEG does not preclude the possibility of epilepsy.   Roland Rack, MD [facility] [phone]  If 7pm- 7am, please page neurology on call as listed in [place].

## 2018-06-20 NOTE — Progress Notes (Signed)
[facility] [place] NAME: Jay Watkins    MR#:  [id number]  DATE OF BIRTH:  06/09/1941  SUBJECTIVE:  CHIEF COMPLAINT: Reporting left leg swelling with pain, intermittent episodes of dizziness out of blue, left ear pain, status post ear wash recently, sees neurology as an outpatient Wife at bedside  REVIEW OF SYSTEMS:  CONSTITUTIONAL: No fever, fatigue or weakness.  EYES: No blurred or double vision.  EARS, NOSE, AND THROAT: No tinnitus or ear pain.  RESPIRATORY: No cough, shortness of breath, wheezing or hemoptysis.  CARDIOVASCULAR: No chest pain, orthopnea, edema.  GASTROINTESTINAL: No nausea, vomiting, diarrhea or abdominal pain.  GENITOURINARY: No dysuria, hematuria.  ENDOCRINE: No polyuria, nocturia,  HEMATOLOGY: No anemia, easy bruising or bleeding SKIN: No rash or lesion. MUSCULOSKELETAL: No joint pain or arthritis.   NEUROLOGIC: No tingling, numbness, weakness.  Reporting intermittent episodes of dizziness PSYCHIATRY: No anxiety or depression.   DRUG ALLERGIES:   Allergies  Allergen Reactions  . Cellcept [Mycophenolate] Swelling  . Vioxx [Rofecoxib]   . Ciprofloxacin     VITALS:  Blood pressure 123/70, pulse 74, temperature 97.6 F (36.4 C), resp. rate 18, height 5\' 5"  (1.651 m), weight 110.9 kg, SpO2 92 %.  PHYSICAL EXAMINATION:  GENERAL:  77 y.o.-year-old patient lying in the bed with no acute distress.  EYES: Pupils equal, round, reactive to light and accommodation. No scleral icterus. Extraocular muscles intact.  HEENT: Head atraumatic, normocephalic. Oropharynx and nasopharynx clear.  NECK:  Supple, no jugular venous distention. No thyroid enlargement, no tenderness.  LUNGS: Normal breath sounds bilaterally, no wheezing, rales,rhonchi or crepitation. No use of accessory muscles of respiration.  CARDIOVASCULAR: S1, S2 normal. No murmurs, rubs, or gallops.  ABDOMEN: Soft, nontender, nondistended. Bowel sounds  present. No organomegaly or mass.  EXTREMITIES: No pedal edema, cyanosis, or clubbing.  NEUROLOGIC: Awake alert and oriented x3. Sensation intact. Gait not checked.  PSYCHIATRIC: The patient is alert and oriented x 3.  SKIN: No obvious rash, lesion, or ulcer.    LABORATORY PANEL:   CBC Recent Labs  Lab 06/20/18 0445  WBC 8.6  HGB 13.0  HCT 42.0  PLT 329   ------------------------------------------------------------------------------------------------------------------  Chemistries  Recent Labs  Lab 06/20/18 0445  NA 139  K 3.6  CL 108  CO2 25  GLUCOSE 111*  BUN 17  CREATININE 1.04  CALCIUM 9.1   ------------------------------------------------------------------------------------------------------------------  Cardiac Enzymes Recent Labs  Lab 06/20/18 1321  TROPONINI <0.03   ------------------------------------------------------------------------------------------------------------------  RADIOLOGY:  Dg Chest 2 View  Result Date: 06/19/2018 CLINICAL DATA:  Shortness of breath. 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 Surgical plate in the lower cervical spine. Both lungs are clear. Negative for a pneumothorax. Heart size is within normal limits and stable. Atherosclerotic calcifications at the aortic arch. Multilevel degenerative disease in the lower thoracic spine. No large pleural effusions. IMPRESSION: No active cardiopulmonary disease. Electronically Signed   By: Markus Daft M.D.   On: 06/19/2018 18:29   US Venous Img Lower Unilateral Left  Result Date: 06/20/2018 CLINICAL DATA:  Left lower extremity pain and edema for several months. Evaluate for DVT. EXAM: LEFT LOWER EXTREMITY VENOUS DOPPLER ULTRASOUND TECHNIQUE: Gray-scale sonography with graded compression, as well as color Doppler and duplex ultrasound were performed to evaluate the lower extremity deep venous systems from the level  of the common femoral vein and including the common femoral, femoral, profunda femoral, popliteal  and calf veins including the posterior tibial, peroneal and gastrocnemius veins when visible. The superficial great saphenous vein was also interrogated. Spectral Doppler was utilized to evaluate flow at rest and with distal augmentation maneuvers in the common femoral, femoral and popliteal veins. COMPARISON:  None. FINDINGS: Contralateral Common Femoral Vein: Respiratory phasicity is normal and symmetric with the symptomatic side. No evidence of thrombus. Normal compressibility. Common Femoral Vein: No evidence of thrombus. Normal compressibility, respiratory phasicity and response to augmentation. Saphenofemoral Junction: No evidence of thrombus. Normal compressibility and flow on color Doppler imaging. Profunda Femoral Vein: No evidence of thrombus. Normal compressibility and flow on color Doppler imaging. Femoral Vein: No evidence of thrombus. Normal compressibility, respiratory phasicity and response to augmentation. Popliteal Vein: No evidence of thrombus. Normal compressibility, respiratory phasicity and response to augmentation. Calf Veins: Appear patent where imaged. Superficial Great Saphenous Vein: No evidence of thrombus. Normal compressibility. Venous Reflux:  None. Other Findings:  None. IMPRESSION: No evidence of DVT within the left lower extremity. Electronically Signed   By: Sandi Mariscal M.D.   On: 06/20/2018 13:01    EKG:   Orders placed or performed during the hospital encounter of 06/19/18  . EKG 12-Lead  . EKG 12-Lead  . ED EKG within 10 minutes  . ED EKG within 10 minutes    ASSESSMENT AND PLAN:     77 year old male patient with history ofCOPD, carpal tunnel syndrome, diverticulosis, dysphagia, GERD, hyperlipidemia, neuropathy had a near syncopal event which was associated with hypoxia and cyanosis when he was having EEG today.   -Near-syncope-multifactorial with a possible  competent of cardiac arrhythmia/middle to inner ear problems/neurological Monitor on telemetry  IV fluid hydration Check orthostatics Acute MI ruled out with negative troponins Outpatient follow-up with ENT Outpatient follow-up with neurology Dr. Melrose Nakayama but EEG ordered Patient might be benefited with outpatient cardiac monitor Cardiology consult placed to Dr. Saralyn Pilar Cardiac monitoring PT evaluation  -Hypoxic spells Unknown etiology Check EEG  -Outpatient sleep study recommended  -Left leg swelling DVT ruled out with negative venous Dopplers  -GERD Continue PPI  -Hyperlipidemia Continue statin medication  -DVT prophylaxis subcu Lovenox daily   All the records are reviewed and case discussed with Care Management/Social Workerr. Management plans discussed with the patient, family and they are in agreement.  CODE STATUS: dnr  TOTAL TIME TAKING CARE OF THIS PATIENT: 39  minutes.   POSSIBLE D/C IN 1  DAYS, DEPENDING ON CLINICAL CONDITION.  Note: This dictation was prepared with Dragon dictation along with smaller phrase technology. Any transcriptional errors that result from this process are unintentional.   Nicholes Mango M.D on 06/20/2018 at 4:02 PM  Between 7am to 6pm - Pager - [phone] After 6pm go to [email or web address] - password [email or web address] [facility]  Office  [phone]  CC: Primary care physician; Dion Body, MD

## 2018-06-20 NOTE — Plan of Care (Signed)
  Problem: Health Behavior/Discharge Planning: Goal: Ability to manage health-related needs will improve Outcome: Progressing Note:  Patient for an EEG today at some point. Will continue to monitor neurological status for remainder of shift. Thus far has been [facility]

## 2018-06-20 NOTE — Evaluation (Signed)
Physical Therapy Evaluation Patient Details Name: Jay Watkins MRN: [id number] DOB: [birth year] Today's Date: 06/20/2018   History of Present Illness  77 y.o. male with a known history of COPD, carpal tunnel syndrome, diverticulosis, dysphagia, GERD, hyperlipidemia, neuropathy had a near syncopal event which was associated with hypoxia and cyanosis when he was having EEG.  Sats into the low 80s, feeling better at time of PT exam.  Clinical Impression  Pt did well with mobility and was able to circumambulate the nurses' station X 2 with brief seated rest break.  O2 difficult to register on pulse ox at times but it appears that he stayed in the high 90s t/o the effort and generally had only mild subjective fatigue.  Overall pt is at/near his baseline with regard to strength, balance, mobility and ambulation and should be able to safely return home w/o further PT follow up.  PT will sign off.    Follow Up Recommendations No PT follow up    Equipment Recommendations       Recommendations for Other Services       Precautions / Restrictions Precautions Precautions: Fall Restrictions Weight Bearing Restrictions: No      Mobility  Bed Mobility Overal bed mobility: Independent             General bed mobility comments: Pt easily gets to EOB w/o assist  Transfers Overall transfer level: Independent Equipment used: None             General transfer comment: Pt able to rise and maintain balance with good confidence, forward stooped posture  Ambulation/Gait Ambulation/Gait assistance: Supervision Gait Distance (Feet): 200 Feet Assistive device: None       General Gait Details: 200 ft x 2, pt with mild limp secondary to decreased L ankle ROM, but safe with no LOBs.  He had some subjective fatigue but on room air O2 remained in the high 90s t/o the effort.   Stairs            Wheelchair Mobility    Modified Rankin (Stroke Patients Only)       Balance                                              Pertinent Vitals/Pain Pain Assessment: No/denies pain    Home Living Family/patient expects to be discharged to:: Private residence Living Arrangements: Spouse/significant other Available Help at Discharge: Family   Home Access: Level entry     [place]: One level        Prior Function Level of Independence: Independent         Comments: Pt generally has been independent with light daily activies, reports he has decreased walking distances/time recently secondary to fatigue/SOB     Hand Dominance        Extremity/Trunk Assessment   Upper Extremity Assessment Upper Extremity Assessment: Overall WFL for tasks assessed(b/l shoulder elevation limited to ~120)    Lower Extremity Assessment Lower Extremity Assessment: Overall WFL for tasks assessed(L ankle fusion)       Communication   Communication: No difficulties  Cognition Arousal/Alertness: Awake/alert Behavior During Therapy: WFL for tasks assessed/performed Overall Cognitive Status: Within Functional Limits for tasks assessed  General Comments      Exercises     Assessment/Plan    PT Assessment Patent does not need any further PT services  PT Problem List         PT Treatment Interventions      PT Goals (Current goals can be found in the Care Plan section)  Acute Rehab PT Goals Patient Stated Goal: go home PT Goal Formulation: All assessment and education complete, DC therapy    Frequency     Barriers to discharge        Co-evaluation               AM-PAC PT "6 Clicks" Mobility  Outcome Measure Help needed turning from your back to your side while in a flat bed without using bedrails?: None Help needed moving from lying on your back to sitting on the side of a flat bed without using bedrails?: None Help needed moving to and from a bed to a chair (including a  wheelchair)?: None Help needed standing up from a chair using your arms (e.g., wheelchair or bedside chair)?: None Help needed to walk in hospital room?: None Help needed climbing 3-5 steps with a railing? : None 6 Click Score: 24    End of Session Equipment Utilized During Treatment: Gait belt Activity Tolerance: Patient tolerated treatment well;Patient limited by fatigue Patient left: with call bell/phone within reach;with chair alarm set;with family/visitor present Nurse Communication: Mobility status      Time: 1400-1421 PT Time Calculation (min) (ACUTE ONLY): 21 min   Charges:   PT Evaluation $PT Eval Low Complexity: 1 Low          Jay Watkins, DPT 06/20/2018, 2:48 PM

## 2018-06-20 NOTE — Care Management Obs Status (Signed)
[place] NOTIFICATION   Patient Details  Name: Jay Watkins MRN: [id number] Date of Birth: Nov 15, 1940   Medicare Observation Status Notification Given:  Yes    Elza Rafter, RN 06/20/2018, 4:02 PM

## 2018-06-20 NOTE — Progress Notes (Signed)
PHARMACIST - PHYSICIAN COMMUNICATION  CONCERNING:  Enoxaparin (Lovenox) for DVT Prophylaxis    RECOMMENDATION: Patient was prescribed enoxaprin 40mg  q24 hours for VTE prophylaxis.   Filed Weights   06/19/18 1654 06/19/18 2308  Weight: 240 lb (108.9 kg) 244 lb 9.6 oz (110.9 kg)    Body mass index is 40.7 kg/m.  Estimated Creatinine Clearance: 70.6 mL/min (by C-G formula based on SCr of 1.04 mg/dL).   Based on [place] patient is candidate for enoxaparin 40mg  every 12 hour dosing due to BMI being >40.    DESCRIPTION: Pharmacy has adjusted enoxaparin dose per [facility] policy.  Patient is now receiving enoxaparin 40mg  every 12 hours.     Lu Duffel, PharmD, BCPS Clinical Pharmacist 06/20/2018 12:00 PM

## 2018-06-20 NOTE — Plan of Care (Signed)
  Problem: Activity: Goal: Risk for activity intolerance will decrease Outcome: Progressing Note:  Up to bathroom with standby assist   Problem: Pain Managment: Goal: General experience of comfort will improve Outcome: Progressing Note:  No complaints of pain this shift   Problem: Safety: Goal: Ability to remain free from injury will improve Outcome: Progressing   Problem: Education: Goal: Knowledge of General Education information will improve Description Including pain rating scale, medication(s)/side effects and non-pharmacologic comfort measures Outcome: Completed/Met

## 2018-06-21 LAB — GLUCOSE, CAPILLARY: Glucose-Capillary: 92 mg/dL (ref 70–99)

## 2018-06-21 MED ORDER — SENNOSIDES-DOCUSATE SODIUM 8.6-50 MG PO TABS: 1.0000 | ORAL_TABLET | Freq: Every evening | ORAL | Status: DC | PRN

## 2018-06-21 NOTE — Plan of Care (Signed)
?  Problem: Clinical Measurements: ?Goal: Ability to maintain clinical measurements within normal limits will improve ?Outcome: Progressing ?Goal: Respiratory complications will improve ?Outcome: Progressing ?  ?Problem: Pain Managment: ?Goal: General experience of comfort will improve ?Outcome: Progressing ?  ?Problem: Safety: ?Goal: Ability to remain free from injury will improve ?Outcome: Progressing ?  ?

## 2018-06-21 NOTE — Discharge Summary (Signed)
[facility] [place] NAME: Jay Watkins    MR#:  [id number]  DATE OF BIRTH:  January 04, 1941  DATE OF ADMISSION:  06/19/2018 ADMITTING PHYSICIAN: Saundra Shelling, MD  DATE OF DISCHARGE: 06/21/18  PRIMARY CARE PHYSICIAN: Dion Body, MD    ADMISSION DIAGNOSIS:  Hypoxia [R09.02] Near syncope [R55]  DISCHARGE DIAGNOSIS:  Active Problems:   Near syncope  History of scleroderma SECONDARY DIAGNOSIS:   Past Medical History:  Diagnosis Date  . Asbestos exposure   . B12 deficiency   . BPH with urinary obstruction    lower urinary tract symptoms  . Carpal tunnel syndrome   . COPD, moderate ([place])   . Difficulty walking   . Diverticulosis   . Dysphagia   . Dyspnea    unspecified  . Edema   . Elevated PSA   . GERD (gastroesophageal reflux disease)   . Gouty arthropathy   . Hyperlipidemia   . Hypotension   . Memory loss   . Neuropathy    idopathic peripheral  . Onychomycosis   . Orthostatic hypotension   . Patent foramen ovale   . Pre-diabetes   . Presence of permanent cardiac pacemaker   . Proteinuria   . Renal calculus   . 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 Scleroderma ([place])   . Sleep apnea   . Spondylosis of cervical spine   . Stasis dermatitis   . Stroke ([place])   . 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 Urinary frequency   . Varicose veins of leg with edema     HOSPITAL COURSE:   HISTORY OF PRESENT ILLNESS: Jay Watkins  is a 77 y.o. male with a known history of COPD, carpal tunnel syndrome, diverticulosis, dysphagia, GERD, hyperlipidemia, neuropathy had a near syncopal event which was associated with hypoxia and cyanosis when he was having EEG today.  He recently had a CT head as an outpatient which did not show any acute abnormality.  Patient was scheduled for EEG today but could not be done because he had an episode of hypoxia and O2 sats were around 80% he was cyanotic.  His oxygen saturation has improved.  No  complaints of any shortness of breath, cough.  Felt lousy for the whole week.  He was evaluated in the emergency room.  Chest x-ray did not reveal any pneumonia.  Hospitalist service was consulted.   -Near-syncope-multifactorial with a possible competent of cardiac arrhythmia/middle to inner ear problems/neurological EEG normal Monitored on telemetry -patient was between normal sinus rhythm and a paced rhythm.  Outpatient follow-up with cardiology Dr. Saralyn Pilar outpatient echocardiogram recommended.  Cardiology might consider putting the patient on event monitor IV fluid hydration provided Check orthostatics negative Acute MI ruled out with negative troponins Outpatient follow-up with ENT to rule out middle and inner ear problems Outpatient follow-up with neurology Dr. Melrose Nakayama PT evaluation-no PT needs identified Outpatient follow-up with pulmonology Dr. Raul Del for repeat pulmonary function tests given the history of scleroderma  -History of obstructive sleep apnea continue CPAP nightly Did not use CPAP last night Hypoxic spells could be from noncompliance with cpap /possible clinical worsening of scleroderma/rule out pulmonary hypertension with echocardiogram   -Left leg swelling DVT ruled out with negative venous Dopplers  -GERD Continue PPI  -Hyperlipidemia Continue statin medication  -DVT prophylaxis subcu Lovenox daily  Plan of care discussed in detail with the patient and the wife at bedside.  They both verbalized understanding of the plan  and discharge home  DISCHARGE CONDITIONS:   fair  CONSULTS OBTAINED:  Treatment Team:  Corey Skains, MD   PROCEDURES  EEG - nml   DRUG ALLERGIES:   Allergies  Allergen Reactions  . Cellcept [Mycophenolate] Swelling  . Vioxx [Rofecoxib]   . Ciprofloxacin     DISCHARGE MEDICATIONS:   Allergies as of 06/21/2018      Reactions   Cellcept [mycophenolate] Swelling   Vioxx [rofecoxib]    Ciprofloxacin        Medication List    TAKE these medications   albuterol 108 (90 Base) MCG/ACT inhaler Commonly known as:  PROVENTIL HFA;VENTOLIN HFA Inhale 2 puffs into the lungs every 6 (six) hours as needed.   ANORO ELLIPTA 62.5-25 MCG/INH Aepb Generic drug:  umeclidinium-vilanterol Inhale 1 puff into the lungs daily.   aspirin 81 MG tablet Take 81 mg by mouth daily.   atorvastatin 80 MG tablet Commonly known as:  LIPITOR Take 80 mg by mouth daily.   clopidogrel 75 MG tablet Commonly known as:  PLAVIX Take 75 mg by mouth daily.   finasteride 5 MG tablet Commonly known as:  PROSCAR Take 5 mg by mouth daily.   meclizine 25 MG tablet Commonly known as:  ANTIVERT Take 25 mg by mouth 3 (three) times daily as needed for dizziness.   midodrine 5 MG tablet Commonly known as:  PROAMATINE Take 5 mg by mouth 3 (three) times daily with meals.   nortriptyline 10 MG capsule Commonly known as:  PAMELOR Take 10 mg by mouth at bedtime.   pantoprazole 40 MG tablet Commonly known as:  PROTONIX Take 40 mg by mouth daily.   senna-docusate 8.6-50 MG tablet Commonly known as:  Senokot-S Take 1 tablet by mouth at bedtime as needed for mild constipation.   tizanidine 2 MG capsule Commonly known as:  ZANAFLEX Take 2 mg by mouth 3 (three) times daily as needed.   vitamin B-12 500 MCG tablet Commonly known as:  CYANOCOBALAMIN Take 500 mcg by mouth daily.        DISCHARGE INSTRUCTIONS:  Follow-up with primary care physician in 3 to 5 days Follow-up with pulmonology Dr. Raul Del in 1 week to 10 days Follow-up with cardiology Dr. Pete Glatter in 1 week to 10 days and get outpatient echocardiogram to rule out pulmonary hypertension Follow-up with ENT Dr. Pryor Ochoa in 2 weeks Outpatient follow-up with neurology Dr. Melrose Nakayama as recommended  DIET:  Cardiac diet  DISCHARGE CONDITION:  Stable  ACTIVITY:  Activity as tolerated  OXYGEN:  Home Oxygen: No.   Oxygen Delivery: room air  DISCHARGE  LOCATION:  home   If you experience worsening of your admission symptoms, develop shortness of breath, life threatening emergency, suicidal or homicidal thoughts you must seek medical attention immediately by calling 911 or calling your MD immediately  if symptoms less severe.  You Must read complete instructions/literature along with all the possible adverse reactions/side effects for all the Medicines you take and that have been prescribed to you. Take any new Medicines after you have completely understood and accpet all the possible adverse reactions/side effects.   Please note  You were cared for by a hospitalist during your hospital stay. If you have any questions about your discharge medications or the care you received while you were in the hospital after you are discharged, you can call the unit and asked to speak with the hospitalist on call if the hospitalist that took care of you is not available. Once you  are discharged, your primary care physician will handle any further medical issues. Please note that NO REFILLS for any discharge medications will be authorized once you are discharged, as it is imperative that you return to your primary care physician (or establish a relationship with a primary care physician if you do not have one) for your aftercare needs so that they can reassess your need for medications and monitor your lab values.     Today  Chief Complaint  Patient presents with  . Shortness of Breath   Patient is doing fine denies any chest pain shortness of breath or dizziness wants to go home  ROS:  CONSTITUTIONAL: Denies fevers, chills. Denies any fatigue, weakness.  EYES: Denies blurry vision, double vision, eye pain. EARS, NOSE, THROAT: Denies tinnitus, ear pain, hearing loss. RESPIRATORY: Denies cough, wheeze, shortness of breath.  CARDIOVASCULAR: Denies chest pain, palpitations, edema.  GASTROINTESTINAL: Denies nausea, vomiting, diarrhea, abdominal pain. Denies  bright red blood per rectum. GENITOURINARY: Denies dysuria, hematuria. ENDOCRINE: Denies nocturia or thyroid problems. HEMATOLOGIC AND LYMPHATIC: Denies easy bruising or bleeding. SKIN: Denies rash or lesion. MUSCULOSKELETAL: Denies pain in neck, back, shoulder, knees, hips or arthritic symptoms.  NEUROLOGIC: Denies paralysis, paresthesias.  PSYCHIATRIC: Denies anxiety or depressive symptoms.   VITAL SIGNS:  Blood pressure 128/67, pulse 60, temperature 98.4 F (36.9 C), temperature source Oral, resp. rate 18, height 5\' 5"  (1.651 m), weight 112.2 kg, SpO2 99 %.  I/O:    Intake/Output Summary (Last 24 hours) at 06/21/2018 1243 Last data filed at 06/21/2018 0900 Gross per 24 hour  Intake -  Output 1300 ml  Net -1300 ml    PHYSICAL EXAMINATION:  GENERAL:  77 y.o.-year-old patient lying in the bed with no acute distress.  EYES: Pupils equal, round, reactive to light and accommodation. No scleral icterus. Extraocular muscles intact.  HEENT: Head atraumatic, normocephalic. Oropharynx and nasopharynx clear.  NECK:  Supple, no jugular venous distention. No thyroid enlargement, no tenderness.  LUNGS: Normal breath sounds bilaterally, no wheezing, rales,rhonchi or crepitation. No use of accessory muscles of respiration.  CARDIOVASCULAR: S1, S2 normal. No murmurs, rubs, or gallops.  ABDOMEN: Soft, non-tender, non-distended. Bowel sounds present. No organomegaly or mass.  EXTREMITIES: No pedal edema, cyanosis, or clubbing.  NEUROLOGIC: Cranial nerves II through XII are intact. Muscle strength 5/5 in all extremities. Sensation intact. Gait not checked.  PSYCHIATRIC: The patient is alert and oriented x 3.  SKIN: No obvious rash, lesion, or ulcer.   DATA REVIEW:   CBC Recent Labs  Lab 06/20/18 0445  WBC 8.6  HGB 13.0  HCT 42.0  PLT 329    Chemistries  Recent Labs  Lab 06/20/18 0445  NA 139  K 3.6  CL 108  CO2 25  GLUCOSE 111*  BUN 17  CREATININE 1.04  CALCIUM 9.1     Cardiac Enzymes Recent Labs  Lab 06/20/18 1321  Grandfather <0.03    Microbiology Results  No results found for this or any previous visit.  RADIOLOGY:  Dg Chest 2 View  Result Date: 06/19/2018 CLINICAL DATA:  Shortness of breath. 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 Surgical plate in the lower cervical spine. Both lungs are clear. Negative for a pneumothorax. Heart size is within normal limits and stable. Atherosclerotic calcifications at the aortic arch. Multilevel degenerative disease in the lower thoracic spine. No large pleural effusions. IMPRESSION: No active cardiopulmonary disease. Electronically Signed   By: Quita Skye  Anselm Pancoast M.D.   On: 06/19/2018 18:29   US Venous Img Lower Unilateral Left  Result Date: 06/20/2018 CLINICAL DATA:  Left lower extremity pain and edema for several months. Evaluate for DVT. EXAM: LEFT LOWER EXTREMITY VENOUS DOPPLER ULTRASOUND TECHNIQUE: Gray-scale sonography with graded compression, as well as color Doppler and duplex ultrasound were performed to evaluate the lower extremity deep venous systems from the level of the common femoral vein and including the common femoral, femoral, profunda femoral, popliteal and calf veins including the posterior tibial, peroneal and gastrocnemius veins when visible. The superficial great saphenous vein was also interrogated. Spectral Doppler was utilized to evaluate flow at rest and with distal augmentation maneuvers in the common femoral, femoral and popliteal veins. COMPARISON:  None. FINDINGS: Contralateral Common Femoral Vein: Respiratory phasicity is normal and symmetric with the symptomatic side. No evidence of thrombus. Normal compressibility. Common Femoral Vein: No evidence of thrombus. Normal compressibility, respiratory phasicity and response to augmentation. Saphenofemoral Junction: No evidence of thrombus. Normal compressibility and flow  on color Doppler imaging. Profunda Femoral Vein: No evidence of thrombus. Normal compressibility and flow on color Doppler imaging. Femoral Vein: No evidence of thrombus. Normal compressibility, respiratory phasicity and response to augmentation. Popliteal Vein: No evidence of thrombus. Normal compressibility, respiratory phasicity and response to augmentation. Calf Veins: Appear patent where imaged. Superficial Great Saphenous Vein: No evidence of thrombus. Normal compressibility. Venous Reflux:  None. Other Findings:  None. IMPRESSION: No evidence of DVT within the left lower extremity. Electronically Signed   By: Sandi Mariscal M.D.   On: 06/20/2018 13:01    EKG:   Orders placed or performed during the hospital encounter of 06/19/18  . EKG 12-Lead  . EKG 12-Lead  . ED EKG within 10 minutes  . ED EKG within 10 minutes  . EKG      Management plans discussed with the patient, family and they are in agreement.  CODE STATUS:     Code Status Orders  (From admission, onward)         Start     Ordered   06/19/18 2129  Do not attempt resuscitation (DNR)  Continuous    Question Answer Comment  In the event of cardiac or respiratory ARREST Do not call a "code blue"   In the event of cardiac or respiratory ARREST Do not perform Intubation, CPR, defibrillation or ACLS   In the event of cardiac or respiratory ARREST Use medication by any route, position, wound care, and other measures to relive pain and suffering. May use oxygen, suction and manual treatment of airway obstruction as needed for comfort.      06/19/18 2129        Code Status History    This patient has a current code status but no historical code status.    Advance Directive Documentation     Most Recent Value  Type of Advance Directive  Healthcare Power of Attorney, Living will  Pre-existing out of facility DNR order (yellow form or pink MOST form)  -  "MOST" Form in Place?  -      TOTAL TIME TAKING CARE OF THIS  PATIENT:  43 minutes.   Note: This dictation was prepared with Dragon dictation along with smaller phrase technology. Any transcriptional errors that result from this process are unintentional.   @[facility] @  on 06/21/2018 at 12:43 PM  Between 7am to 6pm - Pager - [phone]  After 6pm go to [email or web address] - password [email or web address] [facility]  Office  [phone]  CC: Primary care physician; Dion Body, MD

## 2018-06-21 NOTE — Discharge Instructions (Signed)
Follow-up with primary care physician in 3 to 5 days Follow-up with pulmonology Dr. Raul Del in 1 week to 10 days Follow-up with cardiology Dr. Pete Glatter in 1 week to 10 days and get outpatient echocardiogram to rule out pulmonary hypertension Follow-up with ENT Dr. Pryor Ochoa in 2 weeks Outpatient follow-up with neurology Dr. Melrose Nakayama as recommended

## 2018-09-03 ENCOUNTER — Other Ambulatory Visit: Payer: Self-pay

## 2018-09-03 ENCOUNTER — Encounter
Admission: RE | Disposition: A | Discharge status: Home / Self Care | Payer: Self-pay | Source: Home / Self Care | Attending: Cardiology

## 2018-09-03 ENCOUNTER — Ambulatory Visit
Admission: RE | Admit: 2018-09-03 | Discharge: 2018-09-03 | Disposition: A | Discharge status: Home / Self Care | Payer: Medicare HMO | Attending: Cardiology | Admitting: Cardiology

## 2018-09-03 DIAGNOSIS — Z950 Presence of cardiac pacemaker: Secondary | ICD-10-CM | POA: Diagnosis not present

## 2018-09-03 DIAGNOSIS — Z8673 Personal history of transient ischemic attack (TIA), and cerebral infarction without residual deficits: Secondary | ICD-10-CM | POA: Diagnosis not present

## 2018-09-03 DIAGNOSIS — R0602 Shortness of breath: Secondary | ICD-10-CM | POA: Diagnosis present

## 2018-09-03 DIAGNOSIS — M341 CR(E)ST syndrome: Secondary | ICD-10-CM | POA: Insufficient documentation

## 2018-09-03 DIAGNOSIS — G4730 Sleep apnea, unspecified: Secondary | ICD-10-CM | POA: Diagnosis not present

## 2018-09-03 DIAGNOSIS — J449 Chronic obstructive pulmonary disease, unspecified: Secondary | ICD-10-CM | POA: Diagnosis not present

## 2018-09-03 DIAGNOSIS — E785 Hyperlipidemia, unspecified: Secondary | ICD-10-CM | POA: Diagnosis not present

## 2018-09-03 DIAGNOSIS — I495 Sick sinus syndrome: Secondary | ICD-10-CM | POA: Insufficient documentation

## 2018-09-03 DIAGNOSIS — M349 Systemic sclerosis, unspecified: Secondary | ICD-10-CM | POA: Insufficient documentation

## 2018-09-03 DIAGNOSIS — I739 Peripheral vascular disease, unspecified: Secondary | ICD-10-CM | POA: Diagnosis not present

## 2018-09-03 DIAGNOSIS — Z7982 Long term (current) use of aspirin: Secondary | ICD-10-CM | POA: Insufficient documentation

## 2018-09-03 DIAGNOSIS — I10 Essential (primary) hypertension: Secondary | ICD-10-CM | POA: Insufficient documentation

## 2018-09-03 DIAGNOSIS — Z79899 Other long term (current) drug therapy: Secondary | ICD-10-CM | POA: Diagnosis not present

## 2018-09-03 DIAGNOSIS — I714 Abdominal aortic aneurysm, without rupture: Secondary | ICD-10-CM | POA: Diagnosis not present

## 2018-09-03 HISTORY — PX: RIGHT HEART CATH: CATH118263

## 2018-09-03 SURGERY — RIGHT HEART CATH
Anesthesia: Moderate Sedation | Laterality: Right

## 2018-09-03 MED ORDER — ACETAMINOPHEN 325 MG PO TABS: 650.0000 mg | ORAL_TABLET | ORAL | Status: DC | PRN

## 2018-09-03 MED ORDER — ASPIRIN 81 MG PO CHEW: 81.0000 mg | CHEWABLE_TABLET | ORAL | Status: DC

## 2018-09-03 MED ORDER — HEPARIN (PORCINE) IN NACL 1000-0.9 UT/500ML-% IV SOLN
INTRAVENOUS | Status: AC
  Filled 2018-09-03: qty 1000

## 2018-09-03 MED ORDER — MIDAZOLAM HCL 2 MG/2ML IJ SOLN
INTRAMUSCULAR | Status: DC | PRN
  Administered 2018-09-03 (×2): 1 mg via INTRAVENOUS

## 2018-09-03 MED ORDER — SODIUM CHLORIDE 0.9% FLUSH: 3.0000 mL | Freq: Two times a day (BID) | INTRAVENOUS | Status: DC

## 2018-09-03 MED ORDER — SODIUM CHLORIDE 0.9 % WEIGHT BASED INFUSION
329.4000 mL/h | INTRAVENOUS | Status: AC
  Administered 2018-09-03: 3 mL/kg/h via INTRAVENOUS

## 2018-09-03 MED ORDER — FENTANYL CITRATE (PF) 100 MCG/2ML IJ SOLN
INTRAMUSCULAR | Status: DC | PRN
  Administered 2018-09-03 (×2): 25 ug via INTRAVENOUS

## 2018-09-03 MED ORDER — MIDAZOLAM HCL 2 MG/2ML IJ SOLN
INTRAMUSCULAR | Status: AC
  Filled 2018-09-03: qty 2

## 2018-09-03 MED ORDER — FENTANYL CITRATE (PF) 100 MCG/2ML IJ SOLN
INTRAMUSCULAR | Status: AC
  Filled 2018-09-03: qty 2

## 2018-09-03 MED ORDER — ONDANSETRON HCL 4 MG/2ML IJ SOLN: 4.0000 mg | Freq: Four times a day (QID) | INTRAMUSCULAR | Status: DC | PRN

## 2018-09-03 MED ORDER — SODIUM CHLORIDE 0.9 % IV SOLN: 250.0000 mL | INTRAVENOUS | Status: DC | PRN

## 2018-09-03 MED ORDER — HEPARIN (PORCINE) IN NACL 1000-0.9 UT/500ML-% IV SOLN
INTRAVENOUS | Status: DC | PRN
  Administered 2018-09-03: 500 mL

## 2018-09-03 MED ORDER — SODIUM CHLORIDE 0.9 % WEIGHT BASED INFUSION: 1.0000 mL/kg/h | INTRAVENOUS | Status: DC

## 2018-09-03 MED ORDER — SODIUM CHLORIDE 0.9% FLUSH: 3.0000 mL | INTRAVENOUS | Status: DC | PRN

## 2018-09-03 SURGICAL SUPPLY — 12 items
CATH INFINITI 5 FR MPA2 (CATHETERS) ×2 IMPLANT
CATH SWANZ 7F THERMO (CATHETERS) ×4 IMPLANT
COVER PROBE U/S 5X48 (MISCELLANEOUS) ×2 IMPLANT
GUIDEWIRE .025 260CM (WIRE) ×2 IMPLANT
GUIDEWIRE EMER 3M J .025X150CM (WIRE) ×2 IMPLANT
KIT MANI 3VAL PERCEP (MISCELLANEOUS) ×4 IMPLANT
KIT RIGHT HEART (MISCELLANEOUS) ×6 IMPLANT
NDL PERC 18GX7CM (NEEDLE) IMPLANT
NEEDLE PERC 18GX7CM (NEEDLE) ×4 IMPLANT
PACK CARDIAC CATH (CUSTOM PROCEDURE TRAY) ×4 IMPLANT
SHEATH AVANTI 7FRX11 (SHEATH) ×4 IMPLANT
WIRE GUIDERIGHT .035X150 (WIRE) ×2 IMPLANT

## 2018-10-31 ENCOUNTER — Other Ambulatory Visit
Admission: RE | Admit: 2018-10-31 | Discharge: 2018-10-31 | Disposition: A | Discharge status: Home / Self Care | Payer: Medicare HMO | Source: Ambulatory Visit | Attending: Gastroenterology | Admitting: Gastroenterology

## 2018-10-31 DIAGNOSIS — R197 Diarrhea, unspecified: Secondary | ICD-10-CM | POA: Diagnosis present

## 2018-10-31 DIAGNOSIS — K219 Gastro-esophageal reflux disease without esophagitis: Secondary | ICD-10-CM | POA: Diagnosis present

## 2018-10-31 DIAGNOSIS — R634 Abnormal weight loss: Secondary | ICD-10-CM | POA: Insufficient documentation

## 2018-10-31 LAB — GASTROINTESTINAL PANEL BY PCR, STOOL (REPLACES STOOL CULTURE)

## 2018-10-31 LAB — C DIFFICILE QUICK SCREEN W PCR REFLEX
C Diff antigen: NEGATIVE
C Diff interpretation: NOT DETECTED
C Diff toxin: NEGATIVE

## 2018-11-27 ENCOUNTER — Other Ambulatory Visit: Payer: Self-pay | Admitting: Gastroenterology

## 2018-11-27 ENCOUNTER — Other Ambulatory Visit (HOSPITAL_COMMUNITY): Payer: Self-pay | Admitting: Gastroenterology

## 2018-11-27 DIAGNOSIS — R4702 Dysphasia: Secondary | ICD-10-CM

## 2018-11-29 ENCOUNTER — Other Ambulatory Visit: Payer: Self-pay | Admitting: Specialist

## 2018-11-29 DIAGNOSIS — R0902 Hypoxemia: Secondary | ICD-10-CM

## 2018-11-29 DIAGNOSIS — R0609 Other forms of dyspnea: Secondary | ICD-10-CM

## 2018-11-29 DIAGNOSIS — R0602 Shortness of breath: Secondary | ICD-10-CM

## 2018-12-02 ENCOUNTER — Ambulatory Visit
Admission: RE | Admit: 2018-12-02 | Discharge: 2018-12-02 | Disposition: A | Discharge status: Home / Self Care | Payer: Medicare HMO | Source: Ambulatory Visit | Attending: Specialist | Admitting: Specialist

## 2018-12-02 ENCOUNTER — Ambulatory Visit
Admission: RE | Admit: 2018-12-02 | Discharge: 2018-12-02 | Disposition: A | Discharge status: Home / Self Care | Payer: Medicare HMO | Source: Ambulatory Visit | Attending: Gastroenterology | Admitting: Gastroenterology

## 2018-12-02 ENCOUNTER — Other Ambulatory Visit: Payer: Self-pay

## 2018-12-02 DIAGNOSIS — R0602 Shortness of breath: Secondary | ICD-10-CM

## 2018-12-02 DIAGNOSIS — R0902 Hypoxemia: Secondary | ICD-10-CM | POA: Diagnosis present

## 2018-12-02 DIAGNOSIS — R0609 Other forms of dyspnea: Secondary | ICD-10-CM | POA: Insufficient documentation

## 2018-12-02 DIAGNOSIS — R4702 Dysphasia: Secondary | ICD-10-CM | POA: Diagnosis present

## 2019-01-23 ENCOUNTER — Telehealth (INDEPENDENT_AMBULATORY_CARE_PROVIDER_SITE_OTHER): Payer: Self-pay | Admitting: Vascular Surgery

## 2019-01-23 ENCOUNTER — Other Ambulatory Visit (INDEPENDENT_AMBULATORY_CARE_PROVIDER_SITE_OTHER): Payer: Self-pay | Admitting: Vascular Surgery

## 2019-01-23 DIAGNOSIS — I712 Thoracic aortic aneurysm, without rupture, unspecified: Secondary | ICD-10-CM

## 2019-01-23 NOTE — Telephone Encounter (Signed)
[facility] scheduling calling and asking for [place] to change CT ordered to CT angio thoracic due to diagnosis codes. Can you please change the order. AS, CMA

## 2019-01-23 NOTE — Telephone Encounter (Signed)
I wasn't able to cancel the previous CT order since [facility] went ahead and scheduled it. I placed a new order for a CTA chest with BUN / Creatinine.

## 2019-01-27 ENCOUNTER — Other Ambulatory Visit: Payer: Self-pay

## 2019-01-27 ENCOUNTER — Ambulatory Visit
Admission: RE | Admit: 2019-01-27 | Discharge: 2019-01-27 | Disposition: A | Discharge status: Home / Self Care | Payer: Medicare HMO | Source: Ambulatory Visit | Attending: Vascular Surgery | Admitting: Vascular Surgery

## 2019-01-27 DIAGNOSIS — I712 Thoracic aortic aneurysm, without rupture, unspecified: Secondary | ICD-10-CM

## 2019-01-27 LAB — POCT I-STAT CREATININE: Creatinine, Ser: 1.1 mg/dL (ref 0.61–1.24)

## 2019-01-27 MED ORDER — IOHEXOL 350 MG/ML SOLN
75.0000 mL | Freq: Once | INTRAVENOUS | Status: AC | PRN
  Administered 2019-01-27: 75 mL via INTRAVENOUS

## 2019-02-04 ENCOUNTER — Ambulatory Visit (INDEPENDENT_AMBULATORY_CARE_PROVIDER_SITE_OTHER): Payer: Medicare HMO | Admitting: Vascular Surgery

## 2019-02-04 ENCOUNTER — Other Ambulatory Visit: Payer: Self-pay

## 2019-02-04 ENCOUNTER — Encounter (INDEPENDENT_AMBULATORY_CARE_PROVIDER_SITE_OTHER): Payer: Self-pay | Admitting: Vascular Surgery

## 2019-02-04 VITALS — BP 122/78 | HR 99 | Resp 18 | Wt 245.0 lb

## 2019-02-04 DIAGNOSIS — E785 Hyperlipidemia, unspecified: Secondary | ICD-10-CM

## 2019-02-04 DIAGNOSIS — I639 Cerebral infarction, unspecified: Secondary | ICD-10-CM | POA: Diagnosis not present

## 2019-02-04 DIAGNOSIS — R609 Edema, unspecified: Secondary | ICD-10-CM | POA: Diagnosis not present

## 2019-02-04 DIAGNOSIS — I712 Thoracic aortic aneurysm, without rupture, unspecified: Secondary | ICD-10-CM

## 2019-02-04 NOTE — Progress Notes (Signed)
MRN : [id number]  Jay Watkins is a 78 y.o. (1941-03-31) male who presents with chief complaint of  Chief Complaint  Patient presents with   Follow-up    ct results and 1 yr follow up  .  History of Present Illness: Patient returns today in follow up of his thoracic aortic aneurysm.  He is now on continuous oxygen.  He has no signs of peripheral embolization.  His blood pressure control has been pretty good.  He does have intermittent chest pain but this is relatively mild and not that frequent.  His CT scan shows essentially no change in the aneurysm measuring about 3.9 to 4.0 cm in maximal diameter.  This is now not changed in about 2 years.  Current Outpatient Medications  Medication Sig Dispense Refill   albuterol (PROVENTIL HFA;VENTOLIN HFA) 108 (90 Base) MCG/ACT inhaler Inhale 2 puffs into the lungs every 6 (six) hours as needed.      amLODipine (NORVASC) 2.5 MG tablet Take 2.5 mg by mouth daily.     aspirin EC 81 MG tablet Take 81 mg by mouth every Monday, Wednesday, and Friday at 8 PM.     atorvastatin (LIPITOR) 80 MG tablet Take 80 mg by mouth at bedtime.      clopidogrel (PLAVIX) 75 MG tablet Take 75 mg by mouth daily.     dicyclomine (BENTYL) 10 MG capsule Take by mouth.     finasteride (PROSCAR) 5 MG tablet Take 5 mg by mouth daily.     midodrine (PROAMATINE) 5 MG tablet Take 5 mg by mouth 2 (two) times daily.      pantoprazole (PROTONIX) 40 MG tablet Take 40 mg by mouth 2 (two) times daily.      polyethylene glycol-electrolytes (NULYTELY/GOLYTELY) 420 g solution Take as directed for colonic prep.     umeclidinium-vilanterol (ANORO ELLIPTA) 62.5-25 MCG/INH AEPB Inhale 1 puff into the lungs daily.     vitamin B-12 (CYANOCOBALAMIN) 500 MCG tablet Take 500 mcg by mouth daily.     meclizine (ANTIVERT) 25 MG tablet Take 25 mg by mouth 3 (three) times daily as needed for dizziness.     mometasone (ELOCON) 0.1 % lotion Apply 1 application topically daily as  needed (for itching in ears).     senna-docusate (SENOKOT-S) 8.6-50 MG tablet Take 1 tablet by mouth at bedtime as needed for mild constipation. (Patient not taking: Reported on 08/29/2018)     tizanidine (ZANAFLEX) 2 MG capsule Take 2 mg by mouth 2 (two) times daily as needed for muscle spasms.      No current facility-administered medications for this visit.     Past Medical History:  Diagnosis Date   Asbestos exposure    B12 deficiency    BPH with urinary obstruction    lower urinary tract symptoms   Carpal tunnel syndrome    COPD, moderate (HCC)    Difficulty walking    Diverticulosis    Dysphagia    Dyspnea    unspecified   Edema    Elevated PSA    GERD (gastroesophageal reflux disease)    Gouty arthropathy    Hyperlipidemia    Hypotension    Memory loss    Neuropathy    idopathic peripheral   Onychomycosis    Orthostatic hypotension    Patent foramen ovale    Pre-diabetes    Presence of permanent cardiac pacemaker    Proteinuria    Renal calculus    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[place])  Scleroderma ([place])    Sleep apnea    Spondylosis of cervical spine    Stasis dermatitis    Stroke (HCC)    Ulcer of lower limb (HCC)    chronic   Urinary frequency    Varicose veins of leg with edema     Past Surgical History:  Procedure Laterality Date   ANKLE FUSION Left    COLONOSCOPY WITH PROPOFOL N/A 12/29/2016   Procedure: COLONOSCOPY WITH PROPOFOL;  Surgeon: Lollie Sails, MD;  Location: [facility] ENDOSCOPY;  Service: Endoscopy;  Laterality: N/A;   COLONOSCOPY WITH PROPOFOL N/A 01/16/2017   Procedure: COLONOSCOPY WITH PROPOFOL;  Surgeon: Lollie Sails, MD;  Location: [facility] ENDOSCOPY;  Service: Endoscopy;  Laterality: N/A;   ESOPHAGOGASTRODUODENOSCOPY (EGD) WITH PROPOFOL N/A 12/29/2016   Procedure: ESOPHAGOGASTRODUODENOSCOPY (EGD) WITH PROPOFOL;  Surgeon: Lollie Sails, MD;  Location: [facility] ENDOSCOPY;  Service: Endoscopy;  Laterality: N/A;    HEMORRHOID SURGERY     INSERT / REPLACE / REMOVE PACEMAKER     neck fusion     cervical spine   RIGHT HEART CATH N/A 09/03/2018   Procedure: RIGHT HEART CATH;  Surgeon: Isaias Cowman, MD;  Location: [facility] CV LAB;  Service: Cardiovascular;  Laterality: N/A;    Social History Social History   Tobacco Use   Smoking status: Never Smoker   Smokeless tobacco: Never Used  Substance Use Topics   Alcohol use: No   Drug use: No     Family History Family History  Problem Relation Age of Onset   Pancreatic cancer Mother    Cancer Sister    Alzheimer's disease Sister    ALS Sister    Lung cancer Brother    Parkinsonism Brother      Allergies  Allergen Reactions   Cellcept [Mycophenolate] Swelling   Vioxx [Rofecoxib] Other (See Comments)    Unsure of reaction type    Ciprofloxacin Other (See Comments)    Unsure of reaction type    REVIEW OF SYSTEMS(Negative unless checked)  Constitutional: [] ?Weight loss[] ?Fever[] ?Chills Cardiac:[] ?Chest pain[] ?Chest pressure[x] ?Palpitations [] ?Shortness of breath when laying flat [] ?Shortness of breath at rest [x] ?Shortness of breath with exertion. Vascular: [] ?Pain in legs with walking[] ?Pain in legsat rest[] ?Pain in legs when laying flat [] ?Claudication [] ?Pain in feet when walking [] ?Pain in feet at rest [] ?Pain in feet when laying flat [] ?History of DVT [] ?Phlebitis [x] ?Swelling in legs [] ?Varicose veins [] ?Non-healing ulcers Pulmonary: [x] ?Uses home oxygen [x] ?Productive cough[] ?Hemoptysis [] ?Wheeze [x] ?COPD [] ?Asthma Neurologic: [] ?Dizziness [] ?Blackouts [] ?Seizures [x] ?History of stroke [] ?History of TIA[] ?Aphasia [] ?Temporary blindness[] ?Dysphagia [] ?Weaknessor numbness in arms [] ?Weakness or numbnessin legs Musculoskeletal: [x] ?Arthritis [] ?Joint swelling [x] ?Joint pain [x] ?Low back pain Hematologic:[] ?Easy bruising[] ?Easy  bleeding [] ?Hypercoagulable state [] ?Anemic [] ?Hepatitis Gastrointestinal:[] ?Blood in stool[] ?Vomiting blood[x] ?Gastroesophageal reflux/heartburn[] ?Abdominal pain Genitourinary: [] ?Chronic kidney disease [] ?Difficulturination [] ?Frequenturination [] ?Burning with urination[] ?Hematuria Skin: [] ?Rashes [] ?Ulcers [] ?Wounds Psychological: [] ?History of anxiety[] ?History of major depression.  Physical Examination  BP 122/78 (BP Location: Right Arm)    Pulse 99    Resp 18    Wt 245 lb (111.1 kg)    BMI 36.18 kg/m  Gen:  WD/WN, NAD Head: [place]/AT, No temporalis wasting. Ear/Nose/Throat: Hearing grossly intact, nares w/o erythema or drainage Eyes: Conjunctiva clear. Sclera non-icteric Neck: Supple.  Trachea midline Pulmonary:  Good air movement, no use of accessory muscles.  Somewhat diminished but clear bilaterally on supplemental oxygen Cardiac: RRR, no JVD Vascular:  Vessel Right Left  Radial Palpable Palpable  Musculoskeletal: M/S 5/5 throughout.  No deformity or atrophy.  Mild lower extremity edema. Neurologic: Sensation grossly intact in extremities.  Symmetrical.  Speech is fluent.  Psychiatric: Judgment intact, Mood & affect appropriate for pt's clinical situation. Dermatologic: No rashes or ulcers noted.  No cellulitis or open wounds.       Labs Recent Results (from the past 2160 hour(s))  I-STAT creatinine     Status: None   Collection Time: 01/27/19  8:27 AM  Result Value Ref Range   Creatinine, Ser 1.10 0.61 - 1.24 mg/dL    Radiology Ct Angio Chest Aorta W &/or Wo Contrast  Result Date: 01/27/2019 CLINICAL DATA:  Follow-up TAA EXAM: CT ANGIOGRAPHY CHEST WITH CONTRAST TECHNIQUE: Multidetector CT imaging of the chest was performed using the standard protocol during bolus administration of intravenous contrast. Multiplanar CT image reconstructions and MIPs were obtained to evaluate the vascular anatomy.  CONTRAST:  78mL OMNIPAQUE IOHEXOL 350 MG/ML SOLN COMPARISON:  02/11/2018, 02/19/2017, 08/09/2016 FINDINGS: Cardiovascular: Preferential opacification of the thoracic aorta. No change in caliber of the tubular ascending thoracic aorta, measuring 3.9 x 3.9 cm. The aortic root, arch, and descending thoracic aorta are normal in caliber and unchanged when compared to examinations dating back to 08/09/2016. There is minimal, scattered aortic atherosclerosis. Normal heart size. Three-vessel coronary artery calcifications. Small pericardial effusion, unchanged from prior. Mediastinum/Nodes: No enlarged mediastinal, hilar, or axillary lymph nodes. The esophagus is patulous and diffusely fluid-filled. Thyroid gland, trachea, demonstrate no significant findings. Lungs/Pleura: Mild bibasilar scarring of the lung bases. Stable, benign 6 mm pulmonary nodule of the left lung base. No pleural effusion or pneumothorax. Upper Abdomen: No acute abnormality. Musculoskeletal: No chest wall abnormality. Disc degenerative disease and partial ankylosis of the thoracic spine. Review of the MIP images confirms the above findings. IMPRESSION: 1. No change in caliber of the tubular ascending thoracic aorta, measuring 3.9 x 3.9 cm. The aortic root, arch, and descending thoracic aorta are normal in caliber and unchanged when compared to examinations dating back to 08/09/2016. There is minimal, scattered aortic atherosclerosis. 2. Three-vessel coronary artery disease. Small pericardial effusion, unchanged from prior. 3. The esophagus is patulous and diffusely fluid-filled, in keeping with reported diagnosis of scleroderma. Electronically Signed   By: Eddie Candle M.D.   On: 01/27/2019 09:50    Assessment/Plan Stroke [facility]) Does not seem to have a significant residual deficit.  Edema Controlled with elevation and compression  Hyperlipidemia lipid control important in reducing the progression of atherosclerotic disease. Continue statin  therapy  Thoracic aortic aneurysm without rupture ([place]) I have reviewed his CT scan of the chest and he has a 3.9-4 cm ascending thoracic aortic aneurysm which is stable.  This has not shown dramatic growth. This is well below the threshold for prophylactic repair.  He is on antiplatelet therapy and a statin agent.  Blood pressure control is important in avoiding growth of this.  This can be rechecked in 2 years with a CT scan    Leotis Pain, MD  02/04/2019 8:37 AM    This note was created with Dragon medical transcription system.  Any errors from dictation are purely unintentional

## 2019-02-24 ENCOUNTER — Encounter: Payer: Medicare HMO | Attending: Specialist | Admitting: *Deleted

## 2019-02-24 ENCOUNTER — Other Ambulatory Visit: Payer: Self-pay

## 2019-02-24 ENCOUNTER — Encounter: Payer: Self-pay | Admitting: *Deleted

## 2019-02-24 DIAGNOSIS — J449 Chronic obstructive pulmonary disease, unspecified: Secondary | ICD-10-CM | POA: Insufficient documentation

## 2019-02-24 DIAGNOSIS — Z7901 Long term (current) use of anticoagulants: Secondary | ICD-10-CM | POA: Insufficient documentation

## 2019-02-24 DIAGNOSIS — Z77090 Contact with and (suspected) exposure to asbestos: Secondary | ICD-10-CM | POA: Insufficient documentation

## 2019-02-24 DIAGNOSIS — K219 Gastro-esophageal reflux disease without esophagitis: Secondary | ICD-10-CM | POA: Insufficient documentation

## 2019-02-24 DIAGNOSIS — G609 Hereditary and idiopathic neuropathy, unspecified: Secondary | ICD-10-CM | POA: Insufficient documentation

## 2019-02-24 DIAGNOSIS — R413 Other amnesia: Secondary | ICD-10-CM | POA: Insufficient documentation

## 2019-02-24 DIAGNOSIS — M109 Gout, unspecified: Secondary | ICD-10-CM | POA: Insufficient documentation

## 2019-02-24 DIAGNOSIS — I83899 Varicose veins of unspecified lower extremities with other complications: Secondary | ICD-10-CM | POA: Insufficient documentation

## 2019-02-24 DIAGNOSIS — R7303 Prediabetes: Secondary | ICD-10-CM | POA: Insufficient documentation

## 2019-02-24 DIAGNOSIS — Z7982 Long term (current) use of aspirin: Secondary | ICD-10-CM | POA: Insufficient documentation

## 2019-02-24 DIAGNOSIS — R0600 Dyspnea, unspecified: Secondary | ICD-10-CM | POA: Insufficient documentation

## 2019-02-24 DIAGNOSIS — R1310 Dysphagia, unspecified: Secondary | ICD-10-CM | POA: Insufficient documentation

## 2019-02-24 DIAGNOSIS — E785 Hyperlipidemia, unspecified: Secondary | ICD-10-CM | POA: Insufficient documentation

## 2019-02-24 DIAGNOSIS — Z8673 Personal history of transient ischemic attack (TIA), and cerebral infarction without residual deficits: Secondary | ICD-10-CM | POA: Insufficient documentation

## 2019-02-24 DIAGNOSIS — Z7902 Long term (current) use of antithrombotics/antiplatelets: Secondary | ICD-10-CM | POA: Insufficient documentation

## 2019-02-24 DIAGNOSIS — Z79899 Other long term (current) drug therapy: Secondary | ICD-10-CM | POA: Insufficient documentation

## 2019-02-24 DIAGNOSIS — R262 Difficulty in walking, not elsewhere classified: Secondary | ICD-10-CM | POA: Insufficient documentation

## 2019-02-24 NOTE — Progress Notes (Signed)
Virtual Orientation completed. Diagnosis can be found in CE 5/27. EP/RD orientation 8/27 at 9:30

## 2019-02-27 ENCOUNTER — Other Ambulatory Visit: Payer: Self-pay

## 2019-02-27 ENCOUNTER — Other Ambulatory Visit
Admission: RE | Admit: 2019-02-27 | Discharge: 2019-02-27 | Disposition: A | Discharge status: Home / Self Care | Payer: Medicare HMO | Source: Ambulatory Visit | Attending: Gastroenterology | Admitting: Gastroenterology

## 2019-02-27 ENCOUNTER — Encounter: Payer: Medicare HMO | Admitting: *Deleted

## 2019-02-27 VITALS — Ht 69.9 in | Wt 244.9 lb

## 2019-02-27 DIAGNOSIS — M109 Gout, unspecified: Secondary | ICD-10-CM | POA: Diagnosis not present

## 2019-02-27 DIAGNOSIS — Z20828 Contact with and (suspected) exposure to other viral communicable diseases: Secondary | ICD-10-CM | POA: Insufficient documentation

## 2019-02-27 DIAGNOSIS — Z8673 Personal history of transient ischemic attack (TIA), and cerebral infarction without residual deficits: Secondary | ICD-10-CM | POA: Diagnosis not present

## 2019-02-27 DIAGNOSIS — Z7982 Long term (current) use of aspirin: Secondary | ICD-10-CM | POA: Diagnosis not present

## 2019-02-27 DIAGNOSIS — Z77090 Contact with and (suspected) exposure to asbestos: Secondary | ICD-10-CM | POA: Diagnosis not present

## 2019-02-27 DIAGNOSIS — Z01812 Encounter for preprocedural laboratory examination: Secondary | ICD-10-CM | POA: Insufficient documentation

## 2019-02-27 DIAGNOSIS — K219 Gastro-esophageal reflux disease without esophagitis: Secondary | ICD-10-CM | POA: Diagnosis not present

## 2019-02-27 DIAGNOSIS — J449 Chronic obstructive pulmonary disease, unspecified: Secondary | ICD-10-CM | POA: Diagnosis present

## 2019-02-27 DIAGNOSIS — I83899 Varicose veins of unspecified lower extremities with other complications: Secondary | ICD-10-CM | POA: Diagnosis not present

## 2019-02-27 DIAGNOSIS — R413 Other amnesia: Secondary | ICD-10-CM | POA: Diagnosis not present

## 2019-02-27 DIAGNOSIS — R262 Difficulty in walking, not elsewhere classified: Secondary | ICD-10-CM | POA: Diagnosis not present

## 2019-02-27 DIAGNOSIS — E785 Hyperlipidemia, unspecified: Secondary | ICD-10-CM | POA: Diagnosis not present

## 2019-02-27 DIAGNOSIS — Z79899 Other long term (current) drug therapy: Secondary | ICD-10-CM | POA: Diagnosis not present

## 2019-02-27 DIAGNOSIS — Z7901 Long term (current) use of anticoagulants: Secondary | ICD-10-CM | POA: Diagnosis not present

## 2019-02-27 DIAGNOSIS — R0600 Dyspnea, unspecified: Secondary | ICD-10-CM | POA: Diagnosis not present

## 2019-02-27 DIAGNOSIS — R7303 Prediabetes: Secondary | ICD-10-CM | POA: Diagnosis not present

## 2019-02-27 DIAGNOSIS — R1310 Dysphagia, unspecified: Secondary | ICD-10-CM | POA: Diagnosis not present

## 2019-02-27 DIAGNOSIS — Z7902 Long term (current) use of antithrombotics/antiplatelets: Secondary | ICD-10-CM | POA: Diagnosis not present

## 2019-02-27 DIAGNOSIS — G609 Hereditary and idiopathic neuropathy, unspecified: Secondary | ICD-10-CM | POA: Diagnosis not present

## 2019-02-27 LAB — SARS CORONAVIRUS 2 (TAT 6-24 HRS): SARS Coronavirus 2: NEGATIVE

## 2019-02-27 NOTE — Progress Notes (Signed)
Pulmonary Individual Treatment Plan  Patient Details  Name: Jay Watkins MRN: [id number] Date of Birth: 09/21/1940 Referring Provider:     Pulmonary Rehab from 02/27/2019 in [facility] Cardiac and Pulmonary Rehab  Referring Provider  Ancil Linsey MD      Initial Encounter Date:    Pulmonary Rehab from 02/27/2019 in [facility] Cardiac and Pulmonary Rehab  Date  02/27/19      Visit Diagnosis: Chronic obstructive pulmonary disease, unspecified COPD type ([place])  Patient's Home Medications on Admission:  Current Outpatient Medications:  .  albuterol (PROVENTIL HFA;VENTOLIN HFA) 108 (90 Base) MCG/ACT inhaler, Inhale 2 puffs into the lungs every 6 (six) hours as needed. , Disp: , Rfl:  .  amLODipine (NORVASC) 2.5 MG tablet, Take 2.5 mg by mouth daily., Disp: , Rfl:  .  aspirin EC 81 MG tablet, Take 81 mg by mouth every Monday, Wednesday, and Friday at 8 PM., Disp: , Rfl:  .  atorvastatin (LIPITOR) 80 MG tablet, Take 80 mg by mouth at bedtime. , Disp: , Rfl:  .  clopidogrel (PLAVIX) 75 MG tablet, Take 75 mg by mouth daily., Disp: , Rfl:  .  dicyclomine (BENTYL) 10 MG capsule, Take by mouth., Disp: , Rfl:  .  finasteride (PROSCAR) 5 MG tablet, Take 5 mg by mouth daily., Disp: , Rfl:  .  meclizine (ANTIVERT) 25 MG tablet, Take 25 mg by mouth 3 (three) times daily as needed for dizziness., Disp: , Rfl:  .  midodrine (PROAMATINE) 5 MG tablet, Take 5 mg by mouth 2 (two) times daily. , Disp: , Rfl:  .  mometasone (ELOCON) 0.1 % lotion, Apply 1 application topically daily as needed (for itching in ears)., Disp: , Rfl:  .  pantoprazole (PROTONIX) 40 MG tablet, Take 40 mg by mouth 2 (two) times daily. , Disp: , Rfl:  .  polyethylene glycol-electrolytes (NULYTELY/GOLYTELY) 420 g solution, Take as directed for colonic prep., Disp: , Rfl:  .  senna-docusate (SENOKOT-S) 8.6-50 MG tablet, Take 1 tablet by mouth at bedtime as needed for mild constipation. (Patient not taking: Reported on 08/29/2018), Disp: , Rfl:   .  tizanidine (ZANAFLEX) 2 MG capsule, Take 2 mg by mouth 2 (two) times daily as needed for muscle spasms. , Disp: , Rfl:  .  umeclidinium-vilanterol (ANORO ELLIPTA) 62.5-25 MCG/INH AEPB, Inhale 1 puff into the lungs daily., Disp: , Rfl:  .  vitamin B-12 (CYANOCOBALAMIN) 500 MCG tablet, Take 500 mcg by mouth daily., Disp: , Rfl:   Past Medical History: Past Medical History:  Diagnosis Date  . Asbestos exposure   . B12 deficiency   . BPH with urinary obstruction    lower urinary tract symptoms  . Carpal tunnel syndrome   . COPD, moderate ([place])   . Difficulty walking   . Diverticulosis   . Dysphagia   . Dyspnea    unspecified  . Edema   . Elevated PSA   . GERD (gastroesophageal reflux disease)   . Gouty arthropathy   . Hyperlipidemia   . Hypotension   . Memory loss   . Neuropathy    idopathic peripheral  . Onychomycosis   . Orthostatic hypotension   . Patent foramen ovale   . Pre-diabetes   . Presence of permanent cardiac pacemaker   . Proteinuria   . Renal calculus   . 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 Scleroderma ([place])   . Sleep apnea   . Spondylosis of cervical spine   . Stasis dermatitis   . Stroke (  [place])   . 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 Urinary frequency   . Varicose veins of leg with edema     Tobacco Use: Social History   Tobacco Use  Smoking Status Never Smoker  Smokeless Tobacco Never Used    Labs: Recent Review Flowsheet Data    There is no flowsheet data to display.       Pulmonary Assessment Scores: Pulmonary Assessment Scores    Row Name 02/27/19 1047         ADL UCSD   ADL Phase  Entry     SOB Score total  76     Rest  0     Walk  4     Stairs  5     Bath  5     Dress  3     Shop  4       CAT Score   CAT Score  17       mMRC Score   mMRC Score  3        UCSD: Self-administered rating of dyspnea associated with activities of daily living (ADLs) 6-point scale (0 = "not at all" to 5 = "maximal or unable to do because of  breathlessness")  Scoring Scores range from 0 to 120.  Minimally important difference is 5 units  CAT: CAT can identify the health impairment of COPD patients and is better correlated with disease progression.  CAT has a scoring range of zero to 40. The CAT score is classified into four groups of low (less than 10), medium (10 - 20), high (21-30) and very high (31-40) based on the impact level of disease on health status. A CAT score over 10 suggests significant symptoms.  A worsening CAT score could be explained by an exacerbation, poor medication adherence, poor inhaler technique, or progression of COPD or comorbid conditions.  CAT MCID is 2 points  mMRC: mMRC (Modified Medical Research Council) Dyspnea Scale is used to assess the degree of baseline functional disability in patients of respiratory disease due to dyspnea. No minimal important difference is established. A decrease in score of 1 point or greater is considered a positive change.   Pulmonary Function Assessment:   Exercise Target Goals: Exercise Program Goal: Individual exercise prescription set using results from initial 6 min walk test and THRR while considering  patient's activity barriers and safety.   Exercise Prescription Goal: Initial exercise prescription builds to 30-45 minutes a day of aerobic activity, 2-3 days per week.  Home exercise guidelines will be given to patient during program as part of exercise prescription that the participant will acknowledge.  Activity Barriers & Risk Stratification: Activity Barriers & Cardiac Risk Stratification - 02/27/19 1034      Activity Barriers & Cardiac Risk Stratification   Activity Barriers  Back Problems;Other (comment);Joint Problems;Muscular Weakness;Deconditioning;Shortness of Breath;Balance Concerns    Comments  occ back pain and get injections, neuropathy, ankle fused, previous CVA       6 Minute Walk: 6 Minute Walk    Row Name 02/27/19 1030         6 Minute  Walk   Phase  Initial     Distance  780 feet     Walk Time  4.25 minutes     # of Rest Breaks  0 test terminated at 4:15 due to desaturation     MPH  2.09     METS  1.24     RPE  15     Perceived Dyspnea   2     VO2 Peak  4.36     Symptoms  Yes (comment)     Comments  SOB     Resting HR  71 bpm     Resting BP  126/64     Resting Oxygen Saturation   99 %     Exercise Oxygen Saturation  during 6 min walk  79 %     Max Ex. HR  93 bpm     Max Ex. BP  144/74     2 Minute Post BP  126/60       Interval HR   1 Minute HR  90     2 Minute HR  82     3 Minute HR  81     4 Minute HR  79     2 Minute Post HR  66     Interval Heart Rate?  Yes       Interval Oxygen   Interval Oxygen?  Yes     Baseline Oxygen Saturation %  99 %     1 Minute Oxygen Saturation %  90 %     1 Minute Liters of Oxygen  3 L pulsed     2 Minute Oxygen Saturation %  82 %     2 Minute Liters of Oxygen  3 L     3 Minute Oxygen Saturation %  81 %     3 Minute Liters of Oxygen  3 L     4 Minute Oxygen Saturation %  79 % stopped at 4:15     4 Minute Liters of Oxygen  3 L     2 Minute Post Oxygen Saturation %  97 %     2 Minute Post Liters of Oxygen  3 L continuous       Oxygen Initial Assessment: Oxygen Initial Assessment - 02/27/19 1039      Home Oxygen   Home Oxygen Device  Home Concentrator    Sleep Oxygen Prescription  CPAP;Continuous    Liters per minute  3    Home Exercise Oxygen Prescription  Pulsed    Liters per minute  3    Home at Rest Exercise Oxygen Prescription  Continuous    Liters per minute  3    Compliance with Home Oxygen Use  Yes      Initial 6 min Walk   Oxygen Used  Pulsed;Portable Concentrator    Liters per minute  3      Program Oxygen Prescription   Program Oxygen Prescription  Continuous;E-Tanks    Liters per minute  3      Intervention   Short Term Goals  To learn and understand importance of maintaining oxygen saturations>88%;To learn and demonstrate proper pursed lip  breathing techniques or other breathing techniques.;To learn and demonstrate proper use of respiratory medications;To learn and understand importance of monitoring SPO2 with pulse oximeter and demonstrate accurate use of the pulse oximeter.;To learn and exhibit compliance with exercise, home and travel O2 prescription    Long  Term Goals  Verbalizes importance of monitoring SPO2 with pulse oximeter and return demonstration;Exhibits proper breathing techniques, such as pursed lip breathing or other method taught during program session;Compliance with respiratory medication;Maintenance of O2 saturations>88%;Exhibits compliance with exercise, home and travel O2 prescription;Demonstrates proper use of MDI's       Oxygen Re-Evaluation:   Oxygen Discharge (Final Oxygen Re-Evaluation):   Initial Exercise Prescription:  Initial Exercise Prescription - 02/27/19 1000      Date of Initial Exercise RX and Referring Provider   Date  02/27/19    Referring Provider  Ancil Linsey MD      Oxygen   Oxygen  Continuous    Liters  3      Recumbant Bike   Level  1    RPM  50    Watts  15    Minutes  15    METs  1.2      NuStep   Level  1    SPM  80    Minutes  15    METs  1.2      REL-XR   Level  1    Speed  50    Minutes  15    METs  1.2      Prescription Details   Frequency (times per week)  3    Duration  Progress to 30 minutes of continuous aerobic without signs/symptoms of physical distress      Intensity   THRR 40-80% of Max Heartrate  100-129    Ratings of Perceived Exertion  11-13    Perceived Dyspnea  0-4      Progression   Progression  Continue to progress workloads to maintain intensity without signs/symptoms of physical distress.      Resistance Training   Training Prescription  Yes    Weight  3 lbs    Reps  10-15       Perform Capillary Blood Glucose checks as needed.  Exercise Prescription Changes: Exercise Prescription Changes    Row Name 02/27/19 1000              Response to Exercise   Blood Pressure (Admit)  126/64       Blood Pressure (Exercise)  144/74       Blood Pressure (Exit)  126/60       Heart Rate (Admit)  71 bpm       Heart Rate (Exercise)  93 bpm       Heart Rate (Exit)  66 bpm       Oxygen Saturation (Admit)  99 %       Oxygen Saturation (Exercise)  79 %       Oxygen Saturation (Exit)  97 %       Rating of Perceived Exertion (Exercise)  15       Perceived Dyspnea (Exercise)  2       Symptoms  SOB       Comments  walk test results          Exercise Comments:   Exercise Goals and Review: Exercise Goals    Row Name 02/27/19 1037             Exercise Goals   Increase Physical Activity  Yes       Intervention  Provide advice, education, support and counseling about physical activity/exercise needs.;Develop an individualized exercise prescription for aerobic and resistive training based on initial evaluation findings, risk stratification, comorbidities and participant's personal goals.       Expected Outcomes  Short Term: Attend rehab on a regular basis to increase amount of physical activity.;Long Term: Add in home exercise to make exercise part of routine and to increase amount of physical activity.;Long Term: Exercising regularly at least 3-5 days a week.       Increase Strength and Stamina  Yes       Intervention  Provide  advice, education, support and counseling about physical activity/exercise needs.;Develop an individualized exercise prescription for aerobic and resistive training based on initial evaluation findings, risk stratification, comorbidities and participant's personal goals.       Expected Outcomes  Short Term: Increase workloads from initial exercise prescription for resistance, speed, and METs.;Short Term: Perform resistance training exercises routinely during rehab and add in resistance training at home;Long Term: Improve cardiorespiratory fitness, muscular endurance and strength as measured by  increased METs and functional capacity (6MWT)       Able to understand and use rate of perceived exertion (RPE) scale  Yes       Intervention  Provide education and explanation on how to use RPE scale       Expected Outcomes  Short Term: Able to use RPE daily in rehab to express subjective intensity level;Long Term:  Able to use RPE to guide intensity level when exercising independently       Able to understand and use Dyspnea scale  Yes       Intervention  Provide education and explanation on how to use Dyspnea scale       Expected Outcomes  Short Term: Able to use Dyspnea scale daily in rehab to express subjective sense of shortness of breath during exertion;Long Term: Able to use Dyspnea scale to guide intensity level when exercising independently       Knowledge and understanding of Target Heart Rate Range (THRR)  Yes       Intervention  Provide education and explanation of THRR including how the numbers were predicted and where they are located for reference       Expected Outcomes  Short Term: Able to state/look up THRR;Short Term: Able to use daily as guideline for intensity in rehab;Long Term: Able to use THRR to govern intensity when exercising independently       Able to check pulse independently  Yes       Intervention  Provide education and demonstration on how to check pulse in carotid and radial arteries.;Review the importance of being able to check your own pulse for safety during independent exercise       Expected Outcomes  Short Term: Able to explain why pulse checking is important during independent exercise;Long Term: Able to check pulse independently and accurately       Understanding of Exercise Prescription  Yes       Intervention  Provide education, explanation, and written materials on patient's individual exercise prescription       Expected Outcomes  Short Term: Able to explain program exercise prescription;Long Term: Able to explain home exercise prescription to exercise  independently          Exercise Goals Re-Evaluation :   Discharge Exercise Prescription (Final Exercise Prescription Changes): Exercise Prescription Changes - 02/27/19 1000      Response to Exercise   Blood Pressure (Admit)  126/64    Blood Pressure (Exercise)  144/74    Blood Pressure (Exit)  126/60    Heart Rate (Admit)  71 bpm    Heart Rate (Exercise)  93 bpm    Heart Rate (Exit)  66 bpm    Oxygen Saturation (Admit)  99 %    Oxygen Saturation (Exercise)  79 %    Oxygen Saturation (Exit)  97 %    Rating of Perceived Exertion (Exercise)  15    Perceived Dyspnea (Exercise)  2    Symptoms  SOB    Comments  walk test results  Nutrition:  Target Goals: Understanding of nutrition guidelines, daily intake of sodium '1500mg'$ , cholesterol '200mg'$ , calories 30% from fat and 7% or less from saturated fats, daily to have 5 or more servings of fruits and vegetables.  Biometrics: Pre Biometrics - 02/27/19 1038      Pre Biometrics   Height  5' 9.9" (1.775 m)    Weight  244 lb 14.4 oz (111.1 kg)    BMI (Calculated)  35.26    Single Leg Stand  5.03 seconds        Nutrition Therapy Plan and Nutrition Goals: Nutrition Therapy & Goals - 02/27/19 1035      Nutrition Therapy   Diet  Low Na, HH diet    Protein (specify units)  90g    Fiber  30 grams    Whole Grain Foods  3 servings    Saturated Fats  12 max. grams    Fruits and Vegetables  5 servings/day    Sodium  1.5 grams      Personal Nutrition Goals   Nutrition Goal  ST: get results from scopy procedures and then we can discuss healthy snacks LT: get function and mobility back    Comments  Pt wife reports pt will have scrambled eggs with whole grain english muffin for breakfast, L: lean meat (usually chicken, sometimes pork and occassionally hamburgers), salads with low-fat dressing and roasted vegetables and potatoes with olive oil and herbs, D: honey nut cheerios with skim milk. Pt will drink coffee and water during the  day. Pt is mechanically eating as has very low appetite. Pt will have mcdonalds 1x/week. Pt reports not being able to eat certain fruits or yogurt - having scopy to see what is going one. pt reports loving beans.      Intervention Plan   Intervention  Prescribe, educate and counsel regarding individualized specific dietary modifications aiming towards targeted core components such as weight, hypertension, lipid management, diabetes, heart failure and other comorbidities.;Nutrition handout(s) given to patient.    Expected Outcomes  Short Term Goal: Understand basic principles of dietary content, such as calories, fat, sodium, cholesterol and nutrients.;Short Term Goal: A plan has been developed with personal nutrition goals set during dietitian appointment.;Long Term Goal: Adherence to prescribed nutrition plan.       Nutrition Assessments: Nutrition Assessments - 02/27/19 1033      MEDFICTS Scores   Pre Score  23       Nutrition Goals Re-Evaluation:   Nutrition Goals Discharge (Final Nutrition Goals Re-Evaluation):   Psychosocial: Target Goals: Acknowledge presence or absence of significant depression and/or stress, maximize coping skills, provide positive support system. Participant is able to verbalize types and ability to use techniques and skills needed for reducing stress and depression.   Initial Review & Psychosocial Screening: Initial Psych Review & Screening - 02/24/19 1044      Initial Review   Current issues with  None Identified      Family Dynamics   Good Support System?  Yes      Barriers   Psychosocial barriers to participate in program  There are no identifiable barriers or psychosocial needs.;The patient should benefit from training in stress management and relaxation.      Screening Interventions   Interventions  Encouraged to exercise;To provide support and resources with identified psychosocial needs;Provide feedback about the scores to participant     Expected Outcomes  Short Term goal: Utilizing psychosocial counselor, staff and physician to assist with identification of specific Stressors or  current issues interfering with healing process. Setting desired goal for each stressor or current issue identified.;Long Term Goal: Stressors or current issues are controlled or eliminated.;Short Term goal: Identification and review with participant of any Quality of Life or Depression concerns found by scoring the questionnaire.;Long Term goal: The participant improves quality of Life and PHQ9 Scores as seen by post scores and/or verbalization of changes       Quality of Life Scores:  Scores of 19 and below usually indicate a poorer quality of life in these areas.  A difference of  2-3 points is a clinically meaningful difference.  A difference of 2-3 points in the total score of the Quality of Life Index has been associated with significant improvement in overall quality of life, self-image, physical symptoms, and general health in studies assessing change in quality of life.  PHQ-9: Recent Review Flowsheet Data    Depression screen [facility] 2/9 02/27/2019   Decreased Interest 2   Down, Depressed, Hopeless 0   PHQ - 2 Score 2   Altered sleeping 0   Tired, decreased energy 0   Change in appetite 1   Feeling bad or failure about yourself  0   Trouble concentrating 0   Moving slowly or fidgety/restless 0   Suicidal thoughts 0   PHQ-9 Score 3   Difficult doing work/chores Not difficult at all     Interpretation of Total Score  Total Score Depression Severity:  1-4 = Minimal depression, 5-9 = Mild depression, 10-14 = Moderate depression, 15-19 = Moderately severe depression, 20-27 = Severe depression   Psychosocial Evaluation and Intervention:   Psychosocial Re-Evaluation:   Psychosocial Discharge (Final Psychosocial Re-Evaluation):   Education: Education Goals: Education classes will be provided on a weekly basis, covering required topics.  Participant will state understanding/return demonstration of topics presented.  Learning Barriers/Preferences: Learning Barriers/Preferences - 02/24/19 1038      Learning Barriers/Preferences   Learning Barriers  Hearing    Learning Preferences  Individual Instruction       Education Topics:  Initial Evaluation Education: - Verbal, written and demonstration of respiratory meds, oximetry and breathing techniques. Instruction on use of nebulizers and MDIs and importance of monitoring MDI activations.   Pulmonary Rehab from 02/27/2019 in [facility] Cardiac and Pulmonary Rehab  Date  02/27/19  Educator  [facility]  Instruction Review Code  1- Verbalizes Understanding      General Nutrition Guidelines/Fats and Fiber: -Group instruction provided by verbal, written material, models and posters to present the general guidelines for heart healthy nutrition. Gives an explanation and review of dietary fats and fiber.   Controlling Sodium/Reading Food Labels: -Group verbal and written material supporting the discussion of sodium use in heart healthy nutrition. Review and explanation with models, verbal and written materials for utilization of the food label.   Exercise Physiology & General Exercise Guidelines: - Group verbal and written instruction with models to review the exercise physiology of the cardiovascular system and associated critical values. Provides general exercise guidelines with specific guidelines to those with heart or lung disease.    Aerobic Exercise & Resistance Training: - Gives group verbal and written instruction on the various components of exercise. Focuses on aerobic and resistive training programs and the benefits of this training and how to safely progress through these programs.   Flexibility, Balance, Mind/Body Relaxation: Provides group verbal/written instruction on the benefits of flexibility and balance training, including mind/body exercise modes such as yoga, pilates  and tai chi.  Demonstration and  skill practice provided.   Stress and Anxiety: - Provides group verbal and written instruction about the health risks of elevated stress and causes of high stress.  Discuss the correlation between heart/lung disease and anxiety and treatment options. Review healthy ways to manage with stress and anxiety.   Depression: - Provides group verbal and written instruction on the correlation between heart/lung disease and depressed mood, treatment options, and the stigmas associated with seeking treatment.   Exercise & Equipment Safety: - Individual verbal instruction and demonstration of equipment use and safety with use of the equipment.   Pulmonary Rehab from 02/27/2019 in [facility] Cardiac and Pulmonary Rehab  Date  02/27/19  Educator  [facility]  Instruction Review Code  1- Verbalizes Understanding      Infection Prevention: - Provides verbal and written material to individual with discussion of infection control including proper hand washing and proper equipment cleaning during exercise session.   Pulmonary Rehab from 02/27/2019 in [facility] Cardiac and Pulmonary Rehab  Date  02/27/19  Educator  [facility]  Instruction Review Code  1- Verbalizes Understanding      Falls Prevention: - Provides verbal and written material to individual with discussion of falls prevention and safety.   Pulmonary Rehab from 02/27/2019 in [facility] Cardiac and Pulmonary Rehab  Date  02/27/19  Educator  [facility]  Instruction Review Code  1- Verbalizes Understanding      Diabetes: - Individual verbal and written instruction to review signs/symptoms of diabetes, desired ranges of glucose level fasting, after meals and with exercise. Advice that pre and post exercise glucose checks will be done for 3 sessions at entry of program.   Chronic Lung Diseases: - Group verbal and written instruction to review updates, respiratory medications, advancements in procedures and treatments. Discuss use of supplemental  oxygen including available portable oxygen systems, continuous and intermittent flow rates, concentrators, personal use and safety guidelines. Review proper use of inhaler and spacers. Provide informative websites for self-education.    Energy Conservation: - Provide group verbal and written instruction for methods to conserve energy, plan and organize activities. Instruct on pacing techniques, use of adaptive equipment and posture/positioning to relieve shortness of breath.   Triggers and Exacerbations: - Group verbal and written instruction to review types of environmental triggers and ways to prevent exacerbations. Discuss weather changes, air quality and the benefits of nasal washing. Review warning signs and symptoms to help prevent infections. Discuss techniques for effective airway clearance, coughing, and vibrations.   AED/CPR: - Group verbal and written instruction with the use of models to demonstrate the basic use of the AED with the basic ABC's of resuscitation.   Anatomy and Physiology of the Lungs: - Group verbal and written instruction with the use of models to provide basic lung anatomy and physiology related to function, structure and complications of lung disease.   Anatomy & Physiology of the Heart: - Group verbal and written instruction and models provide basic cardiac anatomy and physiology, with the coronary electrical and arterial systems. Review of Valvular disease and Heart Failure   Cardiac Medications: - Group verbal and written instruction to review commonly prescribed medications for heart disease. Reviews the medication, class of the drug, and side effects.   Know Your Numbers and Risk Factors: -Group verbal and written instruction about important numbers in your health.  Discussion of what are risk factors and how they play a role in the disease process.  Review of Cholesterol, Blood Pressure, Diabetes, and BMI and the role they play  in your overall  health.   Sleep Hygiene: -Provides group verbal and written instruction about how sleep can affect your health.  Define sleep hygiene, discuss sleep cycles and impact of sleep habits. Review good sleep hygiene tips.    Other: -Provides group and verbal instruction on various topics (see comments)    Knowledge Questionnaire Score: Knowledge Questionnaire Score - 02/27/19 1048      Knowledge Questionnaire Score   Pre Score  15/18 Education Focus: Oxygen Safety, MDIs        Core Components/Risk Factors/Patient Goals at Admission: Personal Goals and Risk Factors at Admission - 02/27/19 1038      Core Components/Risk Factors/Patient Goals on Admission    Weight Management  Yes;Obesity;Weight Loss    Intervention  Weight Management: Develop a combined nutrition and exercise program designed to reach desired caloric intake, while maintaining appropriate intake of nutrient and fiber, sodium and fats, and appropriate energy expenditure required for the weight goal.;Weight Management: Provide education and appropriate resources to help participant work on and attain dietary goals.;Weight Management/Obesity: Establish reasonable short term and long term weight goals.;Obesity: Provide education and appropriate resources to help participant work on and attain dietary goals.    Admit Weight  244 lb 14.4 oz (111.1 kg)    Goal Weight: Short Term  239 lb (108.4 kg)    Goal Weight: Long Term  230 lb (104.3 kg)    Expected Outcomes  Short Term: Continue to assess and modify interventions until short term weight is achieved;Long Term: Adherence to nutrition and physical activity/exercise program aimed toward attainment of established weight goal;Weight Loss: Understanding of general recommendations for a balanced deficit meal plan, which promotes 1-2 lb weight loss per week and includes a negative energy balance of [phone] kcal/d;Understanding recommendations for meals to include 15-35% energy as protein,  25-35% energy from fat, 35-60% energy from carbohydrates, less than '200mg'$  of dietary cholesterol, 20-35 gm of total fiber daily;Understanding of distribution of calorie intake throughout the day with the consumption of 4-5 meals/snacks    Improve shortness of breath with ADL's  Yes    Intervention  Provide education, individualized exercise plan and daily activity instruction to help decrease symptoms of SOB with activities of daily living.    Expected Outcomes  Short Term: Improve cardiorespiratory fitness to achieve a reduction of symptoms when performing ADLs;Long Term: Be able to perform more ADLs without symptoms or delay the onset of symptoms    Lipids  Yes    Intervention  Provide education and support for participant on nutrition & aerobic/resistive exercise along with prescribed medications to achieve LDL '70mg'$ , HDL >'40mg'$ .    Expected Outcomes  Short Term: Participant states understanding of desired cholesterol values and is compliant with medications prescribed. Participant is following exercise prescription and nutrition guidelines.;Long Term: Cholesterol controlled with medications as prescribed, with individualized exercise RX and with personalized nutrition plan. Value goals: LDL < '70mg'$ , HDL > 40 mg.       Core Components/Risk Factors/Patient Goals Review:    Core Components/Risk Factors/Patient Goals at Discharge (Final Review):    ITP Comments: ITP Comments    Row Name 02/24/19 1042 02/27/19 1030         ITP Comments  Virtual Orientation completed. Diagnosis can be found in CE 5/27. EP/RD orientation 8/27 at 9:30  Completed 6MWT, gym orientation, and RD evaluation. Initial ITP created and sent for review to Dr. Emily Filbert, Medical Director.         Comments: Initial  ITP

## 2019-02-27 NOTE — Patient Instructions (Signed)
Patient Instructions  Patient Details  Name: Jay Watkins MRN: [id number] Date of Birth: 08-06-1940 Referring Provider:  Erby Pian, MD  Below are your personal goals for exercise, nutrition, and risk factors. Our goal is to help you stay on track towards obtaining and maintaining these goals. We will be discussing your progress on these goals with you throughout the program.  Initial Exercise Prescription: Initial Exercise Prescription - 02/27/19 1000      Date of Initial Exercise RX and Referring Provider   Date  02/27/19    Referring Provider  Ancil Linsey MD      Oxygen   Oxygen  Continuous    Liters  3      Recumbant Bike   Level  1    RPM  50    Watts  15    Minutes  15    METs  1.2      NuStep   Level  1    SPM  80    Minutes  15    METs  1.2      REL-XR   Level  1    Speed  50    Minutes  15    METs  1.2      Prescription Details   Frequency (times per week)  3    Duration  Progress to 30 minutes of continuous aerobic without signs/symptoms of physical distress      Intensity   THRR 40-80% of Max Heartrate  100-129    Ratings of Perceived Exertion  11-13    Perceived Dyspnea  0-4      Progression   Progression  Continue to progress workloads to maintain intensity without signs/symptoms of physical distress.      Resistance Training   Training Prescription  Yes    Weight  3 lbs    Reps  10-15       Exercise Goals: Frequency: Be able to perform aerobic exercise two to three times per week in program working toward 2-5 days per week of home exercise.  Intensity: Work with a perceived exertion of 11 (fairly light) - 15 (hard) while following your exercise prescription.  We will make changes to your prescription with you as you progress through the program.   Duration: Be able to do 30 to 45 minutes of continuous aerobic exercise in addition to a 5 minute warm-up and a 5 minute cool-down routine.   Nutrition Goals: Your personal  nutrition goals will be established when you do your nutrition analysis with the dietician.  The following are general nutrition guidelines to follow: Cholesterol < 200mg /day Sodium < 1500mg /day Fiber: Men over 50 yrs - 30 grams per day  Personal Goals: Personal Goals and Risk Factors at Admission - 02/27/19 1038      Core Components/Risk Factors/Patient Goals on Admission    Weight Management  Yes;Obesity;Weight Loss    Intervention  Weight Management: Develop a combined nutrition and exercise program designed to reach desired caloric intake, while maintaining appropriate intake of nutrient and fiber, sodium and fats, and appropriate energy expenditure required for the weight goal.;Weight Management: Provide education and appropriate resources to help participant work on and attain dietary goals.;Weight Management/Obesity: Establish reasonable short term and long term weight goals.;Obesity: Provide education and appropriate resources to help participant work on and attain dietary goals.    Admit Weight  244 lb 14.4 oz (111.1 kg)    Goal Weight: Short Term  239 lb (108.4 kg)  Goal Weight: Long Term  230 lb (104.3 kg)    Expected Outcomes  Short Term: Continue to assess and modify interventions until short term weight is achieved;Long Term: Adherence to nutrition and physical activity/exercise program aimed toward attainment of established weight goal;Weight Loss: Understanding of general recommendations for a balanced deficit meal plan, which promotes 1-2 lb weight loss per week and includes a negative energy balance of [phone] kcal/d;Understanding recommendations for meals to include 15-35% energy as protein, 25-35% energy from fat, 35-60% energy from carbohydrates, less than 200mg  of dietary cholesterol, 20-35 gm of total fiber daily;Understanding of distribution of calorie intake throughout the day with the consumption of 4-5 meals/snacks    Improve shortness of breath with ADL's  Yes     Intervention  Provide education, individualized exercise plan and daily activity instruction to help decrease symptoms of SOB with activities of daily living.    Expected Outcomes  Short Term: Improve cardiorespiratory fitness to achieve a reduction of symptoms when performing ADLs;Long Term: Be able to perform more ADLs without symptoms or delay the onset of symptoms    Lipids  Yes    Intervention  Provide education and support for participant on nutrition & aerobic/resistive exercise along with prescribed medications to achieve LDL 70mg , HDL >40mg .    Expected Outcomes  Short Term: Participant states understanding of desired cholesterol values and is compliant with medications prescribed. Participant is following exercise prescription and nutrition guidelines.;Long Term: Cholesterol controlled with medications as prescribed, with individualized exercise RX and with personalized nutrition plan. Value goals: LDL < 70mg , HDL > 40 mg.       Tobacco Use Initial Evaluation: Social History   Tobacco Use  Smoking Status Never Smoker  Smokeless Tobacco Never Used    Exercise Goals and Review: Exercise Goals    Row Name 02/27/19 1037             Exercise Goals   Increase Physical Activity  Yes       Intervention  Provide advice, education, support and counseling about physical activity/exercise needs.;Develop an individualized exercise prescription for aerobic and resistive training based on initial evaluation findings, risk stratification, comorbidities and participant's personal goals.       Expected Outcomes  Short Term: Attend rehab on a regular basis to increase amount of physical activity.;Long Term: Add in home exercise to make exercise part of routine and to increase amount of physical activity.;Long Term: Exercising regularly at least 3-5 days a week.       Increase Strength and Stamina  Yes       Intervention  Provide advice, education, support and counseling about physical  activity/exercise needs.;Develop an individualized exercise prescription for aerobic and resistive training based on initial evaluation findings, risk stratification, comorbidities and participant's personal goals.       Expected Outcomes  Short Term: Increase workloads from initial exercise prescription for resistance, speed, and METs.;Short Term: Perform resistance training exercises routinely during rehab and add in resistance training at home;Long Term: Improve cardiorespiratory fitness, muscular endurance and strength as measured by increased METs and functional capacity (6MWT)       Able to understand and use rate of perceived exertion (RPE) scale  Yes       Intervention  Provide education and explanation on how to use RPE scale       Expected Outcomes  Short Term: Able to use RPE daily in rehab to express subjective intensity level;Long Term:  Able to use RPE to guide intensity  level when exercising independently       Able to understand and use Dyspnea scale  Yes       Intervention  Provide education and explanation on how to use Dyspnea scale       Expected Outcomes  Short Term: Able to use Dyspnea scale daily in rehab to express subjective sense of shortness of breath during exertion;Long Term: Able to use Dyspnea scale to guide intensity level when exercising independently       Knowledge and understanding of Target Heart Rate Range (THRR)  Yes       Intervention  Provide education and explanation of THRR including how the numbers were predicted and where they are located for reference       Expected Outcomes  Short Term: Able to state/look up THRR;Short Term: Able to use daily as guideline for intensity in rehab;Long Term: Able to use THRR to govern intensity when exercising independently       Able to check pulse independently  Yes       Intervention  Provide education and demonstration on how to check pulse in carotid and radial arteries.;Review the importance of being able to check your  own pulse for safety during independent exercise       Expected Outcomes  Short Term: Able to explain why pulse checking is important during independent exercise;Long Term: Able to check pulse independently and accurately       Understanding of Exercise Prescription  Yes       Intervention  Provide education, explanation, and written materials on patient's individual exercise prescription       Expected Outcomes  Short Term: Able to explain program exercise prescription;Long Term: Able to explain home exercise prescription to exercise independently          Copy of goals given to participant.

## 2019-03-03 ENCOUNTER — Encounter
Admission: RE | Disposition: A | Discharge status: Home / Self Care | Payer: Self-pay | Source: Home / Self Care | Attending: Gastroenterology

## 2019-03-03 ENCOUNTER — Encounter: Payer: Self-pay | Admitting: Gastroenterology

## 2019-03-03 ENCOUNTER — Ambulatory Visit: Payer: Medicare HMO | Admitting: Anesthesiology

## 2019-03-03 ENCOUNTER — Ambulatory Visit
Admission: RE | Admit: 2019-03-03 | Discharge: 2019-03-03 | Disposition: A | Discharge status: Home / Self Care | Payer: Medicare HMO | Attending: Gastroenterology | Admitting: Gastroenterology

## 2019-03-03 ENCOUNTER — Other Ambulatory Visit: Payer: Self-pay

## 2019-03-03 DIAGNOSIS — K5730 Diverticulosis of large intestine without perforation or abscess without bleeding: Secondary | ICD-10-CM | POA: Insufficient documentation

## 2019-03-03 DIAGNOSIS — Z8673 Personal history of transient ischemic attack (TIA), and cerebral infarction without residual deficits: Secondary | ICD-10-CM | POA: Insufficient documentation

## 2019-03-03 DIAGNOSIS — K529 Noninfective gastroenteritis and colitis, unspecified: Secondary | ICD-10-CM | POA: Insufficient documentation

## 2019-03-03 DIAGNOSIS — R1310 Dysphagia, unspecified: Secondary | ICD-10-CM | POA: Insufficient documentation

## 2019-03-03 DIAGNOSIS — Z9981 Dependence on supplemental oxygen: Secondary | ICD-10-CM | POA: Insufficient documentation

## 2019-03-03 DIAGNOSIS — K228 Other specified diseases of esophagus: Secondary | ICD-10-CM | POA: Diagnosis not present

## 2019-03-03 DIAGNOSIS — K224 Dyskinesia of esophagus: Secondary | ICD-10-CM | POA: Diagnosis not present

## 2019-03-03 DIAGNOSIS — M349 Systemic sclerosis, unspecified: Secondary | ICD-10-CM | POA: Diagnosis not present

## 2019-03-03 DIAGNOSIS — K3189 Other diseases of stomach and duodenum: Secondary | ICD-10-CM | POA: Diagnosis not present

## 2019-03-03 DIAGNOSIS — D123 Benign neoplasm of transverse colon: Secondary | ICD-10-CM | POA: Diagnosis not present

## 2019-03-03 DIAGNOSIS — E785 Hyperlipidemia, unspecified: Secondary | ICD-10-CM | POA: Diagnosis not present

## 2019-03-03 DIAGNOSIS — G4730 Sleep apnea, unspecified: Secondary | ICD-10-CM | POA: Diagnosis not present

## 2019-03-03 DIAGNOSIS — J449 Chronic obstructive pulmonary disease, unspecified: Secondary | ICD-10-CM | POA: Diagnosis not present

## 2019-03-03 DIAGNOSIS — Z7902 Long term (current) use of antithrombotics/antiplatelets: Secondary | ICD-10-CM | POA: Diagnosis not present

## 2019-03-03 DIAGNOSIS — R7303 Prediabetes: Secondary | ICD-10-CM | POA: Insufficient documentation

## 2019-03-03 DIAGNOSIS — Z79899 Other long term (current) drug therapy: Secondary | ICD-10-CM | POA: Insufficient documentation

## 2019-03-03 DIAGNOSIS — Z950 Presence of cardiac pacemaker: Secondary | ICD-10-CM | POA: Diagnosis not present

## 2019-03-03 DIAGNOSIS — Z7982 Long term (current) use of aspirin: Secondary | ICD-10-CM | POA: Diagnosis not present

## 2019-03-03 HISTORY — PX: COLONOSCOPY WITH PROPOFOL: SHX5780

## 2019-03-03 HISTORY — DX: Personal history of urinary calculi: Z87.442

## 2019-03-03 HISTORY — PX: ESOPHAGOGASTRODUODENOSCOPY (EGD) WITH PROPOFOL: SHX5813

## 2019-03-03 SURGERY — ESOPHAGOGASTRODUODENOSCOPY (EGD) WITH PROPOFOL
Anesthesia: General

## 2019-03-03 MED ORDER — PROPOFOL 500 MG/50ML IV EMUL
INTRAVENOUS | Status: AC
  Filled 2019-03-03: qty 50

## 2019-03-03 MED ORDER — EPHEDRINE SULFATE 50 MG/ML IJ SOLN
INTRAMUSCULAR | Status: DC | PRN
  Administered 2019-03-03: 10 mg via INTRAVENOUS

## 2019-03-03 MED ORDER — LIDOCAINE HCL (CARDIAC) PF 100 MG/5ML IV SOSY: PREFILLED_SYRINGE | INTRAVENOUS | Status: DC | PRN

## 2019-03-03 MED ORDER — VASOPRESSIN 20 UNIT/ML IV SOLN
INTRAVENOUS | Status: DC | PRN
  Administered 2019-03-03: 2 [IU] via INTRAVENOUS

## 2019-03-03 MED ORDER — EPHEDRINE SULFATE 50 MG/ML IJ SOLN
INTRAMUSCULAR | Status: AC
  Filled 2019-03-03: qty 2

## 2019-03-03 MED ORDER — SODIUM CHLORIDE 0.9 % IV SOLN
INTRAVENOUS | Status: DC
  Administered 2019-03-03 (×2): via INTRAVENOUS

## 2019-03-03 MED ORDER — PROPOFOL 500 MG/50ML IV EMUL
INTRAVENOUS | Status: DC | PRN
  Administered 2019-03-03: 180 ug/kg/min via INTRAVENOUS

## 2019-03-03 MED ORDER — PHENYLEPHRINE HCL (PRESSORS) 10 MG/ML IV SOLN
INTRAVENOUS | Status: DC | PRN
  Administered 2019-03-03: 200 ug via INTRAVENOUS
  Administered 2019-03-03 (×2): 100 ug via INTRAVENOUS
  Administered 2019-03-03: 200 ug via INTRAVENOUS
  Administered 2019-03-03 (×2): 100 ug via INTRAVENOUS

## 2019-03-03 MED ORDER — LIDOCAINE HCL (CARDIAC) PF 100 MG/5ML IV SOSY
PREFILLED_SYRINGE | INTRAVENOUS | Status: DC | PRN
  Administered 2019-03-03: 30 mg via INTRAVENOUS

## 2019-03-03 MED ORDER — PROPOFOL 10 MG/ML IV BOLUS
INTRAVENOUS | Status: DC | PRN
  Administered 2019-03-03: 70 mg via INTRAVENOUS

## 2019-03-03 NOTE — Anesthesia Preprocedure Evaluation (Signed)
Anesthesia Evaluation  Patient identified by MRN, date of birth, ID band Patient awake    Reviewed: Allergy & Precautions, H&P , NPO status , Patient's Chart, lab work & pertinent test results, reviewed documented beta blocker date and time   Airway Mallampati: II   Neck ROM: full    Dental  (+) Poor Dentition   Pulmonary neg pulmonary ROS, shortness of breath, sleep apnea , COPD,  oxygen dependent,    Pulmonary exam normal        Cardiovascular Exercise Tolerance: Poor negative cardio ROS Normal cardiovascular exam+ pacemaker  Rhythm:regular Rate:Normal     Neuro/Psych  Neuromuscular disease CVA negative neurological ROS  negative psych ROS   GI/Hepatic negative GI ROS, Neg liver ROS, GERD  ,  Endo/Other  negative endocrine ROS  Renal/GU Renal diseasenegative Renal ROS  negative genitourinary   Musculoskeletal   Abdominal   Peds  Hematology negative hematology ROS (+)   Anesthesia Other Findings Past Medical History: No date: Asbestos exposure No date: B12 deficiency No date: BPH with urinary obstruction     Comment:  lower urinary tract symptoms No date: Carpal tunnel syndrome No date: COPD, moderate (HCC) No date: Difficulty walking No date: Diverticulosis No date: Dysphagia No date: Dyspnea     Comment:  unspecified No date: Edema No date: Elevated PSA No date: GERD (gastroesophageal reflux disease) No date: Gouty arthropathy No date: History of kidney stones No date: Hyperlipidemia No date: Hypotension No date: Memory loss No date: Neuropathy     Comment:  idopathic peripheral No date: Onychomycosis No date: Orthostatic hypotension No date: Patent foramen ovale No date: Pre-diabetes No date: Presence of permanent cardiac pacemaker No date: Proteinuria No date: Renal calculus 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[place]) No date: Scleroderma (HCC) No date: Sleep apnea No date: Spondylosis of cervical spine No  date: Stasis dermatitis No date: Stroke ([place]) No date: 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 No date: Urinary frequency No date: Varicose veins of leg with edema Past Surgical History: No date: ANKLE FUSION; Left 12/29/2016: COLONOSCOPY WITH PROPOFOL; N/A     Comment:  Procedure: COLONOSCOPY WITH PROPOFOL;  Surgeon:               Lollie Sails, MD;  Location: [facility] ENDOSCOPY;                Service: Endoscopy;  Laterality: N/A; 01/16/2017: COLONOSCOPY WITH PROPOFOL; N/A     Comment:  Procedure: COLONOSCOPY WITH PROPOFOL;  Surgeon:               Lollie Sails, MD;  Location: [facility] ENDOSCOPY;                Service: Endoscopy;  Laterality: N/A; 12/29/2016: ESOPHAGOGASTRODUODENOSCOPY (EGD) WITH PROPOFOL; N/A     Comment:  Procedure: ESOPHAGOGASTRODUODENOSCOPY (EGD) WITH               PROPOFOL;  Surgeon: Lollie Sails, MD;  Location:               [facility] ENDOSCOPY;  Service: Endoscopy;  Laterality: N/A; No date: HEMORRHOID SURGERY No date: INSERT / REPLACE / REMOVE PACEMAKER No date: neck fusion     Comment:  cervical spine 09/03/2018: RIGHT HEART CATH; N/A     Comment:  Procedure: RIGHT HEART CATH;  Surgeon: Isaias Cowman, MD;  Location: [place] CV LAB;  Service:              Cardiovascular;  Laterality: N/A; BMI    Body Mass Index: 35.59 kg/m     Reproductive/Obstetrics negative OB ROS                             Anesthesia Physical Anesthesia Plan  ASA: IV  Anesthesia Plan: General   Post-op Pain Management:    Induction:   PONV Risk Score and Plan:   Airway Management Planned:   Additional Equipment:   Intra-op Plan:   Post-operative Plan:   Informed Consent: I have reviewed the patients History and Physical, chart, labs and discussed the procedure including the risks, benefits and alternatives for the proposed anesthesia with the patient or authorized representative who has indicated his/her  understanding and acceptance.     Dental Advisory Given  Plan Discussed with: CRNA  Anesthesia Plan Comments:         Anesthesia Quick Evaluation

## 2019-03-03 NOTE — Op Note (Addendum)
[facility] Gastroenterology Patient Name: Jay Watkins Procedure Date: 03/03/2019 8:44 AM MRN: [id number] Account #: [id number] Date of Birth: [birth year] Admit Type: Outpatient Age: 78 Room: [facility] ENDO ROOM 3 Gender: Male Note Status: Finalized Procedure:            Upper GI endoscopy Indications:          Dysphagia Providers:            Lollie Sails, MD Referring MD:         Dion Body (Referring MD) Medicines:            Monitored Anesthesia Care Complications:        No immediate complications. Procedure:            Pre-Anesthesia Assessment:                       - ASA Grade Assessment: III - A patient with severe                        systemic disease.                       After obtaining informed consent, the endoscope was                        passed under direct vision. Throughout the procedure,                        the patient's blood pressure, pulse, and oxygen                        saturations were monitored continuously. The Endoscope                        was introduced through the mouth, and advanced to the                        third part of duodenum. The upper GI endoscopy was                        accomplished without difficulty. The patient tolerated                        the procedure well. Findings:      Abnormal motility was noted in the esophagus. The cricopharyngeus was       normal. There is a decrease in motility of the esophageal body. The       distal esophagus/lower esophageal sphincter is open.      The Z-line was variable. Biopsies were taken with a cold forceps for       histology.      The entire examined stomach was normal. Biopsies were taken with a cold       forceps for Helicobacter pylori testing.      Diffuse mild mucosal variance characterized by altered texture was found       in the entire duodenum. Biopsies were taken with a cold forceps for       histology.      The cardia and gastric fundus  were normal on retroflexion, though I was       unable to maintain full insufflation due to loss of air/poor  tone GE       junction. Impression:           - Abnormal esophageal motility, consistent with                        scleroderma.                       - Z-line variable. Biopsied.                       - Normal stomach. Biopsied.                       - Mucosal variant in the duodenum. Biopsied. Recommendation:       - Continue present medications.                       - Return to GI clinic in 4 weeks. Procedure Code(s):    --- Professional ---                       [phone], Esophagogastroduodenoscopy, flexible, transoral;                        with biopsy, single or multiple Diagnosis Code(s):    --- Professional ---                       K22.4, Dyskinesia of esophagus                       K22.8, Other specified diseases of esophagus                       K31.89, Other diseases of stomach and duodenum                       R13.10, Dysphagia, unspecified CPT copyright 2019 American Medical Association. All rights reserved. The codes documented in this report are preliminary and upon coder review may  be revised to meet current compliance requirements. Lollie Sails, MD 03/03/2019 9:25:50 AM This report has been signed electronically. Number of Addenda: 0 Note Initiated On: 03/03/2019 8:44 AM      [facility]

## 2019-03-03 NOTE — Anesthesia Post-op Follow-up Note (Signed)
Anesthesia QCDR form completed.        

## 2019-03-03 NOTE — Op Note (Signed)
[facility] Gastroenterology Patient Name: Jay Watkins Procedure Date: 03/03/2019 8:42 AM MRN: [id number] Account #: [id number] Date of Birth: Nov 06, 1940 Admit Type: Outpatient Age: 78 Room: [facility] ENDO ROOM 3 Gender: Male Note Status: Finalized Procedure:            Colonoscopy Indications:          Chronic diarrhea Providers:            Lollie Sails, MD Referring MD:         Dion Body (Referring MD) Medicines:            Monitored Anesthesia Care Complications:        No immediate complications. Procedure:            Pre-Anesthesia Assessment:                       - ASA Grade Assessment: III - A patient with severe                        systemic disease.                       After obtaining informed consent, the colonoscope was                        passed under direct vision. Throughout the procedure,                        the patient's blood pressure, pulse, and oxygen                        saturations were monitored continuously. The                        Colonoscope was introduced through the anus and                        advanced to the the terminal ileum. The quality of the                        bowel preparation was good. Findings:      Multiple small-mouthed diverticula were found in the sigmoid colon,       descending colon, transverse colon and ascending colon.      Three sessile polyps were found in the hepatic flexure. The polyps were       2 to 4 mm in size. These polyps were removed with a cold biopsy forceps.       Resection and retrieval were complete.      Multiple small-mouthed diverticula were found in the mid sigmoid colon.       Petechia were visualized in association with the diverticular opening.      The terminal ileum appeared normal.      Non-bleeding internal hemorrhoids were found during retroflexion and       during anoscopy. The hemorrhoids were small and Grade I (internal       hemorrhoids that do not  prolapse). Impression:           - Diverticulosis in the sigmoid colon, in the                        descending colon, in  the transverse colon and in the                        ascending colon.                       - Three 2 to 4 mm polyps at the hepatic flexure,                        removed with a cold biopsy forceps. Resected and                        retrieved.                       - Mild diverticulosis in the mid sigmoid colon.                        Petechia were visualized in association with the                        diverticular opening. Recommendation:       - Discharge patient to home.                       - Use Citrucel one tablespoon PO daily daily. Procedure Code(s):    --- Professional ---                       [phone], Colonoscopy, flexible; with biopsy, single or                        multiple CPT copyright 2019 American Medical Association. All rights reserved. The codes documented in this report are preliminary and upon coder review may  be revised to meet current compliance requirements. Lollie Sails, MD 03/03/2019 9:57:11 AM This report has been signed electronically. Number of Addenda: 0 Note Initiated On: 03/03/2019 8:42 AM Scope Withdrawal Time: 0 hours 10 minutes 52 seconds  Total Procedure Duration: 0 hours 23 minutes 34 seconds       [facility]

## 2019-03-03 NOTE — Transfer of Care (Signed)
Immediate Anesthesia Transfer of Care Note  Patient: Jay Watkins  Procedure(s) Performed: ESOPHAGOGASTRODUODENOSCOPY (EGD) WITH PROPOFOL (N/A ) COLONOSCOPY WITH PROPOFOL (N/A )  Patient Location: PACU  Anesthesia Type:General  Level of Consciousness: sedated  Airway & Oxygen Therapy: Patient Spontanous Breathing and Patient connected to nasal cannula oxygen  Post-op Assessment: Report given to RN and Post -op Vital signs reviewed and stable  Post vital signs: Reviewed and stable  Last Vitals:  Vitals Value Taken Time  BP 157/90 03/03/19 1005  Temp    Pulse 68 03/03/19 1005  Resp 15 03/03/19 1005  SpO2 96 % 03/03/19 1005  Vitals shown include unvalidated device data.  Last Pain:  Vitals:   03/03/19 1000  TempSrc:   PainSc: 0-No pain         Complications: No apparent anesthesia complications

## 2019-03-03 NOTE — Anesthesia Procedure Notes (Signed)
Date/Time: 03/03/2019 9:05 AM Performed by: Allean Found, CRNA Pre-anesthesia Checklist: Patient identified, Emergency Drugs available, Suction available, Patient being monitored and Timeout performed Patient Re-evaluated:Patient Re-evaluated prior to induction Oxygen Delivery Method: Nasal cannula Placement Confirmation: positive ETCO2

## 2019-03-03 NOTE — H&P (Signed)
Outpatient short stay form Pre-procedure 03/03/2019 8:50 AM Jay Sails MD  Primary Physician: Dr Dion Body  Reason for visit: EGD and colonoscopy  History of present illness: Patient is a 78 year old male presenting today for an EGD and colonoscopy.  He has a known history of scleroderma with difficulties of dysphagia.  However is also been having increasing problems with intermittent diarrhea over the.  Past 3 months.  Patient does take metformin.  He does not regurgitate foods.  He does take Plavix which has been held for 7 days.  He takes 81 mg aspirin as well.  Takes no other aspirin products.  Patient does take Protonix twice daily.    Current Facility-Administered Medications:  .  0.9 %  sodium chloride infusion, , Intravenous, Continuous, Jay Sails, MD, Last Rate: 20 mL/hr at 03/03/19 [id number]  Medications Prior to Admission  Medication Sig Dispense Refill Last Dose  . aspirin EC 81 MG tablet Take 81 mg by mouth every Monday, Wednesday, and Friday at 8 PM.   Past Month at Unknown time  . cholestyramine (QUESTRAN) 4 g packet Take 4 g by mouth daily.     . clopidogrel (PLAVIX) 75 MG tablet Take 75 mg by mouth daily.   Past Month at Unknown time  . nortriptyline (PAMELOR) 10 MG capsule Take 10 mg by mouth at bedtime.     Marland Kitchen albuterol (PROVENTIL HFA;VENTOLIN HFA) 108 (90 Base) MCG/ACT inhaler Inhale 2 puffs into the lungs every 6 (six) hours as needed.      Marland Kitchen amLODipine (NORVASC) 2.5 MG tablet Take 2.5 mg by mouth daily.     Marland Kitchen atorvastatin (LIPITOR) 80 MG tablet Take 80 mg by mouth at bedtime.      . dicyclomine (BENTYL) 10 MG capsule Take by mouth 3 (three) times daily as needed (diarrhea).      . finasteride (PROSCAR) 5 MG tablet Take 5 mg by mouth daily.     . meclizine (ANTIVERT) 25 MG tablet Take 25 mg by mouth 3 (three) times daily as needed for dizziness.     . midodrine (PROAMATINE) 5 MG tablet Take 5 mg by mouth 2 (two) times daily.      . mometasone (ELOCON)  0.1 % lotion Apply 1 application topically daily as needed (for itching in ears).     . pantoprazole (PROTONIX) 40 MG tablet Take 40 mg by mouth 2 (two) times daily.      . polyethylene glycol-electrolytes (NULYTELY/GOLYTELY) 420 g solution Take as directed for colonic prep.     . senna-docusate (SENOKOT-S) 8.6-50 MG tablet Take 1 tablet by mouth at bedtime as needed for mild constipation. (Patient not taking: Reported on 08/29/2018)     . tizanidine (ZANAFLEX) 2 MG capsule Take 2 mg by mouth 2 (two) times daily as needed for muscle spasms.      Marland Kitchen umeclidinium-vilanterol (ANORO ELLIPTA) 62.5-25 MCG/INH AEPB Inhale 1 puff into the lungs daily.     . vitamin B-12 (CYANOCOBALAMIN) 500 MCG tablet Take 500 mcg by mouth daily.        Allergies  Allergen Reactions  . Cellcept [Mycophenolate] Swelling  . Vioxx [Rofecoxib] Other (See Comments)    Unsure of reaction type   . Ciprofloxacin Other (See Comments)    Unsure of reaction type     Past Medical History:  Diagnosis Date  . Asbestos exposure   . B12 deficiency   . BPH with urinary obstruction    lower urinary tract symptoms  .  Carpal tunnel syndrome   . COPD, moderate ([place])   . Difficulty walking   . Diverticulosis   . Dysphagia   . Dyspnea    unspecified  . Edema   . Elevated PSA   . GERD (gastroesophageal reflux disease)   . Gouty arthropathy   . History of kidney stones   . Hyperlipidemia   . Hypotension   . Memory loss   . Neuropathy    idopathic peripheral  . Onychomycosis   . Orthostatic hypotension   . Patent foramen ovale   . Pre-diabetes   . Presence of permanent cardiac pacemaker   . Proteinuria   . Renal calculus   . 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 Scleroderma ([place])   . Sleep apnea   . Spondylosis of cervical spine   . Stasis dermatitis   . Stroke ([place])   . 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 Urinary frequency   . Varicose veins of leg with edema     Review of systems:      Physical Exam    Heart and  lungs: Regular rate and rhythm without rub or gallop lungs are bilaterally clear    HEENT: Normocephalic atraumatic eyes are anicteric    Other:    Pertinant exam for procedure: Soft nontender nondistended bowel sounds positive normoactive    Planned proceedures: EGD, colonoscopy and indicated procedures. I have discussed the risks benefits and complications of procedures to include not limited to bleeding, infection, perforation and the risk of sedation and the patient wishes to proceed.    Jay Sails, MD Gastroenterology 03/03/2019  8:50 AM

## 2019-03-05 LAB — SURGICAL PATHOLOGY

## 2019-03-05 NOTE — Anesthesia Postprocedure Evaluation (Signed)
Anesthesia Post Note  Patient: Jay Watkins  Procedure(s) Performed: ESOPHAGOGASTRODUODENOSCOPY (EGD) WITH PROPOFOL (N/A ) COLONOSCOPY WITH PROPOFOL (N/A )  Patient location during evaluation: PACU Anesthesia Type: General Level of consciousness: awake and alert Pain management: pain level controlled Vital Signs Assessment: post-procedure vital signs reviewed and stable Respiratory status: spontaneous breathing, nonlabored ventilation, respiratory function stable and patient connected to nasal cannula oxygen Cardiovascular status: blood pressure returned to baseline and stable Postop Assessment: no apparent nausea or vomiting Anesthetic complications: no     Last Vitals:  Vitals:   03/03/19 1100 03/03/19 1110  BP: 125/65 108/83  Pulse: (!) 59 (!) 59  Resp: 16 18  Temp:    SpO2: 97% 98%    Last Pain:  Vitals:   03/04/19 0723  TempSrc:   PainSc: 0-No pain                 Molli Barrows

## 2019-03-06 ENCOUNTER — Encounter: Payer: Medicare HMO | Attending: Specialist | Admitting: *Deleted

## 2019-03-06 ENCOUNTER — Other Ambulatory Visit: Payer: Self-pay

## 2019-03-06 DIAGNOSIS — M109 Gout, unspecified: Secondary | ICD-10-CM | POA: Diagnosis not present

## 2019-03-06 DIAGNOSIS — R0600 Dyspnea, unspecified: Secondary | ICD-10-CM | POA: Diagnosis not present

## 2019-03-06 DIAGNOSIS — E785 Hyperlipidemia, unspecified: Secondary | ICD-10-CM | POA: Diagnosis not present

## 2019-03-06 DIAGNOSIS — Z8673 Personal history of transient ischemic attack (TIA), and cerebral infarction without residual deficits: Secondary | ICD-10-CM | POA: Diagnosis not present

## 2019-03-06 DIAGNOSIS — Z7902 Long term (current) use of antithrombotics/antiplatelets: Secondary | ICD-10-CM | POA: Diagnosis not present

## 2019-03-06 DIAGNOSIS — G609 Hereditary and idiopathic neuropathy, unspecified: Secondary | ICD-10-CM | POA: Insufficient documentation

## 2019-03-06 DIAGNOSIS — Z79899 Other long term (current) drug therapy: Secondary | ICD-10-CM | POA: Diagnosis not present

## 2019-03-06 DIAGNOSIS — I83899 Varicose veins of unspecified lower extremities with other complications: Secondary | ICD-10-CM | POA: Insufficient documentation

## 2019-03-06 DIAGNOSIS — K219 Gastro-esophageal reflux disease without esophagitis: Secondary | ICD-10-CM | POA: Insufficient documentation

## 2019-03-06 DIAGNOSIS — R413 Other amnesia: Secondary | ICD-10-CM | POA: Insufficient documentation

## 2019-03-06 DIAGNOSIS — R262 Difficulty in walking, not elsewhere classified: Secondary | ICD-10-CM | POA: Insufficient documentation

## 2019-03-06 DIAGNOSIS — R7303 Prediabetes: Secondary | ICD-10-CM | POA: Insufficient documentation

## 2019-03-06 DIAGNOSIS — Z7982 Long term (current) use of aspirin: Secondary | ICD-10-CM | POA: Insufficient documentation

## 2019-03-06 DIAGNOSIS — R1310 Dysphagia, unspecified: Secondary | ICD-10-CM | POA: Insufficient documentation

## 2019-03-06 DIAGNOSIS — J449 Chronic obstructive pulmonary disease, unspecified: Secondary | ICD-10-CM | POA: Diagnosis not present

## 2019-03-06 DIAGNOSIS — Z77090 Contact with and (suspected) exposure to asbestos: Secondary | ICD-10-CM | POA: Diagnosis not present

## 2019-03-06 DIAGNOSIS — Z7901 Long term (current) use of anticoagulants: Secondary | ICD-10-CM | POA: Diagnosis not present

## 2019-03-06 NOTE — Progress Notes (Signed)
Daily Session Note  Patient Details  Name: Jay Watkins MRN: [id number] Date of Birth: 1940/09/02 Referring Provider:     Pulmonary Rehab from 02/27/2019 in [facility]  Referring Provider  Ancil Linsey MD      Encounter Date: 03/06/2019  Check In: Session Check In - 03/06/19 0841      Check-In   Supervising physician immediately available to respond to emergencies  See telemetry face sheet for immediately available ER MD    Location  [facility] & Pul[facility]    Staff Present  Heath Lark, RN, BSN, CCRP;Amanda Sommer, BA, ACSM CEP, Exercise Physiologist;Jeanna Durrell BS, Exercise Physiologist    Virtual Visit  No    Medication changes reported      No    Fall or balance concerns reported     No    Warm-up and Cool-down  Performed on first and last piece of equipment    Resistance Training Performed  Yes    VAD Patient?  No    PAD/SET Patient?  No      Pain Assessment   Currently in Pain?  No/denies          Social History   Tobacco Use  Smoking Status Never Smoker  Smokeless Tobacco Never Used    Goals Met:  Proper associated with RPD/PD & O2 Sat Exercise tolerated well Personal goals reviewed Queuing for purse lip breathing No report of cardiac concerns or symptoms Strength training completed today  Goals Unmet:  Not Applicable  Comments: First full day of exercise!  Patient was oriented to gym and equipment including functions, settings, policies, and procedures.  Patient's individual exercise prescription and treatment plan were reviewed.  All starting workloads were established based on the results of the 6 minute walk test done at initial orientation visit.  The plan for exercise progression was also introduced and progression will be customized based on patient's performance and goals.   Dr. Emily Filbert is Medical Director for [facility].

## 2019-03-12 ENCOUNTER — Encounter: Payer: Self-pay | Admitting: *Deleted

## 2019-03-12 ENCOUNTER — Encounter: Payer: Medicare HMO | Admitting: *Deleted

## 2019-03-12 ENCOUNTER — Other Ambulatory Visit: Payer: Self-pay

## 2019-03-12 DIAGNOSIS — J449 Chronic obstructive pulmonary disease, unspecified: Secondary | ICD-10-CM | POA: Diagnosis not present

## 2019-03-12 NOTE — Progress Notes (Signed)
Pulmonary Individual Treatment Plan  Patient Details  Name: Jay Watkins MRN: [id number] Date of Birth: 14-Jan-1941 Referring Provider:     Pulmonary Rehab from 02/27/2019 in [facility] Cardiac and Pulmonary Rehab  Referring Provider  Ancil Linsey MD      Initial Encounter Date:    Pulmonary Rehab from 02/27/2019 in [facility] Cardiac and Pulmonary Rehab  Date  02/27/19      Visit Diagnosis: Chronic obstructive pulmonary disease, unspecified COPD type ([place])  Patient's Home Medications on Admission:  Current Outpatient Medications:  .  albuterol (PROVENTIL HFA;VENTOLIN HFA) 108 (90 Base) MCG/ACT inhaler, Inhale 2 puffs into the lungs every 6 (six) hours as needed. , Disp: , Rfl:  .  amLODipine (NORVASC) 2.5 MG tablet, Take 2.5 mg by mouth daily., Disp: , Rfl:  .  aspirin EC 81 MG tablet, Take 81 mg by mouth every Monday, Wednesday, and Friday at 8 PM., Disp: , Rfl:  .  atorvastatin (LIPITOR) 80 MG tablet, Take 80 mg by mouth at bedtime. , Disp: , Rfl:  .  cholestyramine (QUESTRAN) 4 g packet, Take 4 g by mouth daily., Disp: , Rfl:  .  clopidogrel (PLAVIX) 75 MG tablet, Take 75 mg by mouth daily., Disp: , Rfl:  .  dicyclomine (BENTYL) 10 MG capsule, Take by mouth 3 (three) times daily as needed (diarrhea). , Disp: , Rfl:  .  finasteride (PROSCAR) 5 MG tablet, Take 5 mg by mouth daily., Disp: , Rfl:  .  meclizine (ANTIVERT) 25 MG tablet, Take 25 mg by mouth 3 (three) times daily as needed for dizziness., Disp: , Rfl:  .  midodrine (PROAMATINE) 5 MG tablet, Take 5 mg by mouth 2 (two) times daily. , Disp: , Rfl:  .  mometasone (ELOCON) 0.1 % lotion, Apply 1 application topically daily as needed (for itching in ears)., Disp: , Rfl:  .  nortriptyline (PAMELOR) 10 MG capsule, Take 10 mg by mouth at bedtime., Disp: , Rfl:  .  pantoprazole (PROTONIX) 40 MG tablet, Take 40 mg by mouth 2 (two) times daily. , Disp: , Rfl:  .  polyethylene glycol-electrolytes (NULYTELY/GOLYTELY) 420 g solution, Take as  directed for colonic prep., Disp: , Rfl:  .  senna-docusate (SENOKOT-S) 8.6-50 MG tablet, Take 1 tablet by mouth at bedtime as needed for mild constipation. (Patient not taking: Reported on 08/29/2018), Disp: , Rfl:  .  tizanidine (ZANAFLEX) 2 MG capsule, Take 2 mg by mouth 2 (two) times daily as needed for muscle spasms. , Disp: , Rfl:  .  umeclidinium-vilanterol (ANORO ELLIPTA) 62.5-25 MCG/INH AEPB, Inhale 1 puff into the lungs daily., Disp: , Rfl:  .  vitamin B-12 (CYANOCOBALAMIN) 500 MCG tablet, Take 500 mcg by mouth daily., Disp: , Rfl:   Past Medical History: Past Medical History:  Diagnosis Date  . Asbestos exposure   . B12 deficiency   . BPH with urinary obstruction    lower urinary tract symptoms  . Carpal tunnel syndrome   . COPD, moderate ([place])   . Difficulty walking   . Diverticulosis   . Dysphagia   . Dyspnea    unspecified  . Edema   . Elevated PSA   . GERD (gastroesophageal reflux disease)   . Gouty arthropathy   . History of kidney stones   . Hyperlipidemia   . Hypotension   . Memory loss   . Neuropathy    idopathic peripheral  . Onychomycosis   . Orthostatic hypotension   . Patent foramen ovale   .  Pre-diabetes   . Presence of permanent cardiac pacemaker   . Proteinuria   . Renal calculus   . 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 Scleroderma ([place])   . Sleep apnea   . Spondylosis of cervical spine   . Stasis dermatitis   . Stroke ([place])   . 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 Urinary frequency   . Varicose veins of leg with edema     Tobacco Use: Social History   Tobacco Use  Smoking Status Never Smoker  Smokeless Tobacco Never Used    Labs: Recent Review Flowsheet Data    There is no flowsheet data to display.       Pulmonary Assessment Scores: Pulmonary Assessment Scores    Row Name 02/27/19 1047         ADL UCSD   ADL Phase  Entry     SOB Score total  76     Rest  0     Walk  4     Stairs  5     Bath  5     Dress  3     Shop  4        CAT Score   CAT Score  17       mMRC Score   mMRC Score  3        UCSD: Self-administered rating of dyspnea associated with activities of daily living (ADLs) 6-point scale (0 = "not at all" to 5 = "maximal or unable to do because of breathlessness")  Scoring Scores range from 0 to 120.  Minimally important difference is 5 units  CAT: CAT can identify the health impairment of COPD patients and is better correlated with disease progression.  CAT has a scoring range of zero to 40. The CAT score is classified into four groups of low (less than 10), medium (10 - 20), high (21-30) and very high (31-40) based on the impact level of disease on health status. A CAT score over 10 suggests significant symptoms.  A worsening CAT score could be explained by an exacerbation, poor medication adherence, poor inhaler technique, or progression of COPD or comorbid conditions.  CAT MCID is 2 points  mMRC: mMRC (Modified Medical Research Council) Dyspnea Scale is used to assess the degree of baseline functional disability in patients of respiratory disease due to dyspnea. No minimal important difference is established. A decrease in score of 1 point or greater is considered a positive change.   Pulmonary Function Assessment:   Exercise Target Goals: Exercise Program Goal: Individual exercise prescription set using results from initial 6 min walk test and THRR while considering  patient's activity barriers and safety.   Exercise Prescription Goal: Initial exercise prescription builds to 30-45 minutes a day of aerobic activity, 2-3 days per week.  Home exercise guidelines will be given to patient during program as part of exercise prescription that the participant will acknowledge.  Activity Barriers & Risk Stratification: Activity Barriers & Cardiac Risk Stratification - 02/27/19 1034      Activity Barriers & Cardiac Risk Stratification   Activity Barriers  Back Problems;Other (comment);Joint  Problems;Muscular Weakness;Deconditioning;Shortness of Breath;Balance Concerns    Comments  occ back pain and get injections, neuropathy, ankle fused, previous CVA       6 Minute Walk: 6 Minute Walk    Row Name 02/27/19 1030         6 Minute Walk   Phase  Initial  Distance  780 feet     Walk Time  4.25 minutes     # of Rest Breaks  0 test terminated at 4:15 due to desaturation     MPH  2.09     METS  1.24     RPE  15     Perceived Dyspnea   2     VO2 Peak  4.36     Symptoms  Yes (comment)     Comments  SOB     Resting HR  71 bpm     Resting BP  126/64     Resting Oxygen Saturation   99 %     Exercise Oxygen Saturation  during 6 min walk  79 %     Max Ex. HR  93 bpm     Max Ex. BP  144/74     2 Minute Post BP  126/60       Interval HR   1 Minute HR  90     2 Minute HR  82     3 Minute HR  81     4 Minute HR  79     2 Minute Post HR  66     Interval Heart Rate?  Yes       Interval Oxygen   Interval Oxygen?  Yes     Baseline Oxygen Saturation %  99 %     1 Minute Oxygen Saturation %  90 %     1 Minute Liters of Oxygen  3 L pulsed     2 Minute Oxygen Saturation %  82 %     2 Minute Liters of Oxygen  3 L     3 Minute Oxygen Saturation %  81 %     3 Minute Liters of Oxygen  3 L     4 Minute Oxygen Saturation %  79 % stopped at 4:15     4 Minute Liters of Oxygen  3 L     2 Minute Post Oxygen Saturation %  97 %     2 Minute Post Liters of Oxygen  3 L continuous       Oxygen Initial Assessment: Oxygen Initial Assessment - 02/27/19 1039      Home Oxygen   Home Oxygen Device  Home Concentrator    Sleep Oxygen Prescription  CPAP;Continuous    Liters per minute  3    Home Exercise Oxygen Prescription  Pulsed    Liters per minute  3    Home at Rest Exercise Oxygen Prescription  Continuous    Liters per minute  3    Compliance with Home Oxygen Use  Yes      Initial 6 min Walk   Oxygen Used  Pulsed;Portable Concentrator    Liters per minute  3      Program  Oxygen Prescription   Program Oxygen Prescription  Continuous;E-Tanks    Liters per minute  3      Intervention   Short Term Goals  To learn and understand importance of maintaining oxygen saturations>88%;To learn and demonstrate proper pursed lip breathing techniques or other breathing techniques.;To learn and demonstrate proper use of respiratory medications;To learn and understand importance of monitoring SPO2 with pulse oximeter and demonstrate accurate use of the pulse oximeter.;To learn and exhibit compliance with exercise, home and travel O2 prescription    Long  Term Goals  Verbalizes importance of monitoring SPO2 with pulse oximeter and return demonstration;Exhibits proper breathing techniques,  such as pursed lip breathing or other method taught during program session;Compliance with respiratory medication;Maintenance of O2 saturations>88%;Exhibits compliance with exercise, home and travel O2 prescription;Demonstrates proper use of MDI's       Oxygen Re-Evaluation: Oxygen Re-Evaluation    Row Name 03/06/19 0844             Program Oxygen Prescription   Program Oxygen Prescription  Continuous;E-Tanks       Liters per minute  3         Home Oxygen   Home Oxygen Device  Home Concentrator       Sleep Oxygen Prescription  CPAP;Continuous       Liters per minute  3       Home Exercise Oxygen Prescription  Pulsed       Liters per minute  3       Home at Rest Exercise Oxygen Prescription  Continuous       Liters per minute  3       Compliance with Home Oxygen Use  Yes         Goals/Expected Outcomes   Short Term Goals  To learn and understand importance of maintaining oxygen saturations>88%;To learn and demonstrate proper pursed lip breathing techniques or other breathing techniques.;To learn and understand importance of monitoring SPO2 with pulse oximeter and demonstrate accurate use of the pulse oximeter.       Long  Term Goals  Verbalizes importance of monitoring SPO2 with  pulse oximeter and return demonstration;Exhibits proper breathing techniques, such as pursed lip breathing or other method taught during program session;Maintenance of O2 saturations>88%       Comments  Reviewed PLB technique with pt.  Talked about how it work and it's important to maintaining his exercise saturations.       Goals/Expected Outcomes  Short: Become more profiecient at using PLB.   Long: Become independent at using PLB.          Oxygen Discharge (Final Oxygen Re-Evaluation): Oxygen Re-Evaluation - 03/06/19 0844      Program Oxygen Prescription   Program Oxygen Prescription  Continuous;E-Tanks    Liters per minute  3      Home Oxygen   Home Oxygen Device  Home Concentrator    Sleep Oxygen Prescription  CPAP;Continuous    Liters per minute  3    Home Exercise Oxygen Prescription  Pulsed    Liters per minute  3    Home at Rest Exercise Oxygen Prescription  Continuous    Liters per minute  3    Compliance with Home Oxygen Use  Yes      Goals/Expected Outcomes   Short Term Goals  To learn and understand importance of maintaining oxygen saturations>88%;To learn and demonstrate proper pursed lip breathing techniques or other breathing techniques.;To learn and understand importance of monitoring SPO2 with pulse oximeter and demonstrate accurate use of the pulse oximeter.    Long  Term Goals  Verbalizes importance of monitoring SPO2 with pulse oximeter and return demonstration;Exhibits proper breathing techniques, such as pursed lip breathing or other method taught during program session;Maintenance of O2 saturations>88%    Comments  Reviewed PLB technique with pt.  Talked about how it work and it's important to maintaining his exercise saturations.    Goals/Expected Outcomes  Short: Become more profiecient at using PLB.   Long: Become independent at using PLB.       Initial Exercise Prescription: Initial Exercise Prescription - 02/27/19 1000  Date of Initial Exercise RX  and Referring Provider   Date  02/27/19    Referring Provider  Ancil Linsey MD      Oxygen   Oxygen  Continuous    Liters  3      Recumbant Bike   Level  1    RPM  50    Watts  15    Minutes  15    METs  1.2      NuStep   Level  1    SPM  80    Minutes  15    METs  1.2      REL-XR   Level  1    Speed  50    Minutes  15    METs  1.2      Prescription Details   Frequency (times per week)  3    Duration  Progress to 30 minutes of continuous aerobic without signs/symptoms of physical distress      Intensity   THRR 40-80% of Max Heartrate  100-129    Ratings of Perceived Exertion  11-13    Perceived Dyspnea  0-4      Progression   Progression  Continue to progress workloads to maintain intensity without signs/symptoms of physical distress.      Resistance Training   Training Prescription  Yes    Weight  3 lbs    Reps  10-15       Perform Capillary Blood Glucose checks as needed.  Exercise Prescription Changes: Exercise Prescription Changes    Row Name 02/27/19 1000             Response to Exercise   Blood Pressure (Admit)  126/64       Blood Pressure (Exercise)  144/74       Blood Pressure (Exit)  126/60       Heart Rate (Admit)  71 bpm       Heart Rate (Exercise)  93 bpm       Heart Rate (Exit)  66 bpm       Oxygen Saturation (Admit)  99 %       Oxygen Saturation (Exercise)  79 %       Oxygen Saturation (Exit)  97 %       Rating of Perceived Exertion (Exercise)  15       Perceived Dyspnea (Exercise)  2       Symptoms  SOB       Comments  walk test results          Exercise Comments: Exercise Comments    Row Name 03/06/19 [phone]           Exercise Comments  First full day of exercise!  Patient was oriented to gym and equipment including functions, settings, policies, and procedures.  Patient's individual exercise prescription and treatment plan were reviewed.  All starting workloads were established based on the results of the 6 minute walk  test done at initial orientation visit.  The plan for exercise progression was also introduced and progression will be customized based on patient's performance and goals.          Exercise Goals and Review: Exercise Goals    Row Name 02/27/19 1037             Exercise Goals   Increase Physical Activity  Yes       Intervention  Provide advice, education, support and counseling about physical activity/exercise needs.;Develop an individualized exercise  prescription for aerobic and resistive training based on initial evaluation findings, risk stratification, comorbidities and participant's personal goals.       Expected Outcomes  Short Term: Attend rehab on a regular basis to increase amount of physical activity.;Long Term: Add in home exercise to make exercise part of routine and to increase amount of physical activity.;Long Term: Exercising regularly at least 3-5 days a week.       Increase Strength and Stamina  Yes       Intervention  Provide advice, education, support and counseling about physical activity/exercise needs.;Develop an individualized exercise prescription for aerobic and resistive training based on initial evaluation findings, risk stratification, comorbidities and participant's personal goals.       Expected Outcomes  Short Term: Increase workloads from initial exercise prescription for resistance, speed, and METs.;Short Term: Perform resistance training exercises routinely during rehab and add in resistance training at home;Long Term: Improve cardiorespiratory fitness, muscular endurance and strength as measured by increased METs and functional capacity (6MWT)       Able to understand and use rate of perceived exertion (RPE) scale  Yes       Intervention  Provide education and explanation on how to use RPE scale       Expected Outcomes  Short Term: Able to use RPE daily in rehab to express subjective intensity level;Long Term:  Able to use RPE to guide intensity level when  exercising independently       Able to understand and use Dyspnea scale  Yes       Intervention  Provide education and explanation on how to use Dyspnea scale       Expected Outcomes  Short Term: Able to use Dyspnea scale daily in rehab to express subjective sense of shortness of breath during exertion;Long Term: Able to use Dyspnea scale to guide intensity level when exercising independently       Knowledge and understanding of Target Heart Rate Range (THRR)  Yes       Intervention  Provide education and explanation of THRR including how the numbers were predicted and where they are located for reference       Expected Outcomes  Short Term: Able to state/look up THRR;Short Term: Able to use daily as guideline for intensity in rehab;Long Term: Able to use THRR to govern intensity when exercising independently       Able to check pulse independently  Yes       Intervention  Provide education and demonstration on how to check pulse in carotid and radial arteries.;Review the importance of being able to check your own pulse for safety during independent exercise       Expected Outcomes  Short Term: Able to explain why pulse checking is important during independent exercise;Long Term: Able to check pulse independently and accurately       Understanding of Exercise Prescription  Yes       Intervention  Provide education, explanation, and written materials on patient's individual exercise prescription       Expected Outcomes  Short Term: Able to explain program exercise prescription;Long Term: Able to explain home exercise prescription to exercise independently          Exercise Goals Re-Evaluation : Exercise Goals Re-Evaluation    Row Name 03/06/19 0842             Exercise Goal Re-Evaluation   Exercise Goals Review  Increase Physical Activity;Increase Strength and Stamina;Able to understand and use rate of perceived exertion (  RPE) scale;Knowledge and understanding of Target Heart Rate Range  (THRR);Understanding of Exercise Prescription       Comments  Reviewed RPE scale, THR and program prescription with pt today.  Pt voiced understanding and was given a copy of goals to take home.       Expected Outcomes  Short: Use RPE daily to regulate intensity. Long: Follow program prescription in THR.          Discharge Exercise Prescription (Final Exercise Prescription Changes): Exercise Prescription Changes - 02/27/19 1000      Response to Exercise   Blood Pressure (Admit)  126/64    Blood Pressure (Exercise)  144/74    Blood Pressure (Exit)  126/60    Heart Rate (Admit)  71 bpm    Heart Rate (Exercise)  93 bpm    Heart Rate (Exit)  66 bpm    Oxygen Saturation (Admit)  99 %    Oxygen Saturation (Exercise)  79 %    Oxygen Saturation (Exit)  97 %    Rating of Perceived Exertion (Exercise)  15    Perceived Dyspnea (Exercise)  2    Symptoms  SOB    Comments  walk test results       Nutrition:  Target Goals: Understanding of nutrition guidelines, daily intake of sodium <1567m, cholesterol <2070m calories 30% from fat and 7% or less from saturated fats, daily to have 5 or more servings of fruits and vegetables.  Biometrics: Pre Biometrics - 02/27/19 1038      Pre Biometrics   Height  5' 9.9" (1.775 m)    Weight  244 lb 14.4 oz (111.1 kg)    BMI (Calculated)  35.26    Single Leg Stand  5.03 seconds        Nutrition Therapy Plan and Nutrition Goals: Nutrition Therapy & Goals - 02/27/19 1035      Nutrition Therapy   Diet  Low Na, HH diet    Protein (specify units)  90g    Fiber  30 grams    Whole Grain Foods  3 servings    Saturated Fats  12 max. grams    Fruits and Vegetables  5 servings/day    Sodium  1.5 grams      Personal Nutrition Goals   Nutrition Goal  ST: get results from scopy procedures and then we can discuss healthy snacks LT: get function and mobility back    Comments  Pt wife reports pt will have scrambled eggs with whole grain english muffin for  breakfast, L: lean meat (usually chicken, sometimes pork and occassionally hamburgers), salads with low-fat dressing and roasted vegetables and potatoes with olive oil and herbs, D: honey nut cheerios with skim milk. Pt will drink coffee and water during the day. Pt is mechanically eating as has very low appetite. Pt will have mcdonalds 1x/week. Pt reports not being able to eat certain fruits or yogurt - having scopy to see what is going one. pt reports loving beans.      Intervention Plan   Intervention  Prescribe, educate and counsel regarding individualized specific dietary modifications aiming towards targeted core components such as weight, hypertension, lipid management, diabetes, heart failure and other comorbidities.;Nutrition handout(s) given to patient.    Expected Outcomes  Short Term Goal: Understand basic principles of dietary content, such as calories, fat, sodium, cholesterol and nutrients.;Short Term Goal: A plan has been developed with personal nutrition goals set during dietitian appointment.;Long Term Goal: Adherence to prescribed nutrition plan.  Nutrition Assessments: Nutrition Assessments - 02/27/19 1033      MEDFICTS Scores   Pre Score  23       Nutrition Goals Re-Evaluation:   Nutrition Goals Discharge (Final Nutrition Goals Re-Evaluation):   Psychosocial: Target Goals: Acknowledge presence or absence of significant depression and/or stress, maximize coping skills, provide positive support system. Participant is able to verbalize types and ability to use techniques and skills needed for reducing stress and depression.   Initial Review & Psychosocial Screening: Initial Psych Review & Screening - 02/24/19 1044      Initial Review   Current issues with  None Identified      Family Dynamics   Good Support System?  Yes      Barriers   Psychosocial barriers to participate in program  There are no identifiable barriers or psychosocial needs.;The patient should  benefit from training in stress management and relaxation.      Screening Interventions   Interventions  Encouraged to exercise;To provide support and resources with identified psychosocial needs;Provide feedback about the scores to participant    Expected Outcomes  Short Term goal: Utilizing psychosocial counselor, staff and physician to assist with identification of specific Stressors or current issues interfering with healing process. Setting desired goal for each stressor or current issue identified.;Long Term Goal: Stressors or current issues are controlled or eliminated.;Short Term goal: Identification and review with participant of any Quality of Life or Depression concerns found by scoring the questionnaire.;Long Term goal: The participant improves quality of Life and PHQ9 Scores as seen by post scores and/or verbalization of changes       Quality of Life Scores:  Scores of 19 and below usually indicate a poorer quality of life in these areas.  A difference of  2-3 points is a clinically meaningful difference.  A difference of 2-3 points in the total score of the Quality of Life Index has been associated with significant improvement in overall quality of life, self-image, physical symptoms, and general health in studies assessing change in quality of life.  PHQ-9: Recent Review Flowsheet Data    Depression screen [facility] 2/9 02/27/2019   Decreased Interest 2   Down, Depressed, Hopeless 0   PHQ - 2 Score 2   Altered sleeping 0   Tired, decreased energy 0   Change in appetite 1   Feeling bad or failure about yourself  0   Trouble concentrating 0   Moving slowly or fidgety/restless 0   Suicidal thoughts 0   PHQ-9 Score 3   Difficult doing work/chores Not difficult at all     Interpretation of Total Score  Total Score Depression Severity:  1-4 = Minimal depression, 5-9 = Mild depression, 10-14 = Moderate depression, 15-19 = Moderately severe depression, 20-27 = Severe depression    Psychosocial Evaluation and Intervention:   Psychosocial Re-Evaluation:   Psychosocial Discharge (Final Psychosocial Re-Evaluation):   Education: Education Goals: Education classes will be provided on a weekly basis, covering required topics. Participant will state understanding/return demonstration of topics presented.  Learning Barriers/Preferences: Learning Barriers/Preferences - 02/24/19 1038      Learning Barriers/Preferences   Learning Barriers  Hearing    Learning Preferences  Individual Instruction       Education Topics:  Initial Evaluation Education: - Verbal, written and demonstration of respiratory meds, oximetry and breathing techniques. Instruction on use of nebulizers and MDIs and importance of monitoring MDI activations.   Pulmonary Rehab from 02/27/2019 in [facility] Cardiac and Pulmonary Rehab  Date  02/27/19  Educator  [place]  Instruction Review Code  1- Verbalizes Understanding      General Nutrition Guidelines/Fats and Fiber: -Group instruction provided by verbal, written material, models and posters to present the general guidelines for heart healthy nutrition. Gives an explanation and review of dietary fats and fiber.   Controlling Sodium/Reading Food Labels: -Group verbal and written material supporting the discussion of sodium use in heart healthy nutrition. Review and explanation with models, verbal and written materials for utilization of the food label.   Exercise Physiology & General Exercise Guidelines: - Group verbal and written instruction with models to review the exercise physiology of the cardiovascular system and associated critical values. Provides general exercise guidelines with specific guidelines to those with heart or lung disease.    Aerobic Exercise & Resistance Training: - Gives group verbal and written instruction on the various components of exercise. Focuses on aerobic and resistive training programs and the benefits of this  training and how to safely progress through these programs.   Flexibility, Balance, Mind/Body Relaxation: Provides group verbal/written instruction on the benefits of flexibility and balance training, including mind/body exercise modes such as yoga, pilates and tai chi.  Demonstration and skill practice provided.   Stress and Anxiety: - Provides group verbal and written instruction about the health risks of elevated stress and causes of high stress.  Discuss the correlation between heart/lung disease and anxiety and treatment options. Review healthy ways to manage with stress and anxiety.   Depression: - Provides group verbal and written instruction on the correlation between heart/lung disease and depressed mood, treatment options, and the stigmas associated with seeking treatment.   Exercise & Equipment Safety: - Individual verbal instruction and demonstration of equipment use and safety with use of the equipment.   Pulmonary Rehab from 02/27/2019 in [facility] Cardiac and Pulmonary Rehab  Date  02/27/19  Educator  [facility]  Instruction Review Code  1- Verbalizes Understanding      Infection Prevention: - Provides verbal and written material to individual with discussion of infection control including proper hand washing and proper equipment cleaning during exercise session.   Pulmonary Rehab from 02/27/2019 in [facility] Cardiac and Pulmonary Rehab  Date  02/27/19  Educator  [facility]  Instruction Review Code  1- Verbalizes Understanding      Falls Prevention: - Provides verbal and written material to individual with discussion of falls prevention and safety.   Pulmonary Rehab from 02/27/2019 in [facility] Cardiac and Pulmonary Rehab  Date  02/27/19  Educator  [facility]  Instruction Review Code  1- Verbalizes Understanding      Diabetes: - Individual verbal and written instruction to review signs/symptoms of diabetes, desired ranges of glucose level fasting, after meals and with exercise. Advice that pre and  post exercise glucose checks will be done for 3 sessions at entry of program.   Chronic Lung Diseases: - Group verbal and written instruction to review updates, respiratory medications, advancements in procedures and treatments. Discuss use of supplemental oxygen including available portable oxygen systems, continuous and intermittent flow rates, concentrators, personal use and safety guidelines. Review proper use of inhaler and spacers. Provide informative websites for self-education.    Energy Conservation: - Provide group verbal and written instruction for methods to conserve energy, plan and organize activities. Instruct on pacing techniques, use of adaptive equipment and posture/positioning to relieve shortness of breath.   Triggers and Exacerbations: - Group verbal and written instruction to review types of environmental triggers and ways to prevent exacerbations.  Discuss weather changes, air quality and the benefits of nasal washing. Review warning signs and symptoms to help prevent infections. Discuss techniques for effective airway clearance, coughing, and vibrations.   AED/CPR: - Group verbal and written instruction with the use of models to demonstrate the basic use of the AED with the basic ABC's of resuscitation.   Anatomy and Physiology of the Lungs: - Group verbal and written instruction with the use of models to provide basic lung anatomy and physiology related to function, structure and complications of lung disease.   Anatomy & Physiology of the Heart: - Group verbal and written instruction and models provide basic cardiac anatomy and physiology, with the coronary electrical and arterial systems. Review of Valvular disease and Heart Failure   Cardiac Medications: - Group verbal and written instruction to review commonly prescribed medications for heart disease. Reviews the medication, class of the drug, and side effects.   Know Your Numbers and Risk Factors: -Group  verbal and written instruction about important numbers in your health.  Discussion of what are risk factors and how they play a role in the disease process.  Review of Cholesterol, Blood Pressure, Diabetes, and BMI and the role they play in your overall health.   Sleep Hygiene: -Provides group verbal and written instruction about how sleep can affect your health.  Define sleep hygiene, discuss sleep cycles and impact of sleep habits. Review good sleep hygiene tips.    Other: -Provides group and verbal instruction on various topics (see comments)    Knowledge Questionnaire Score: Knowledge Questionnaire Score - 02/27/19 1048      Knowledge Questionnaire Score   Pre Score  15/18 Education Focus: Oxygen Safety, MDIs        Core Components/Risk Factors/Patient Goals at Admission: Personal Goals and Risk Factors at Admission - 02/27/19 1038      Core Components/Risk Factors/Patient Goals on Admission    Weight Management  Yes;Obesity;Weight Loss    Intervention  Weight Management: Develop a combined nutrition and exercise program designed to reach desired caloric intake, while maintaining appropriate intake of nutrient and fiber, sodium and fats, and appropriate energy expenditure required for the weight goal.;Weight Management: Provide education and appropriate resources to help participant work on and attain dietary goals.;Weight Management/Obesity: Establish reasonable short term and long term weight goals.;Obesity: Provide education and appropriate resources to help participant work on and attain dietary goals.    Admit Weight  244 lb 14.4 oz (111.1 kg)    Goal Weight: Short Term  239 lb (108.4 kg)    Goal Weight: Long Term  230 lb (104.3 kg)    Expected Outcomes  Short Term: Continue to assess and modify interventions until short term weight is achieved;Long Term: Adherence to nutrition and physical activity/exercise program aimed toward attainment of established weight goal;Weight Loss:  Understanding of general recommendations for a balanced deficit meal plan, which promotes 1-2 lb weight loss per week and includes a negative energy balance of [phone] kcal/d;Understanding recommendations for meals to include 15-35% energy as protein, 25-35% energy from fat, 35-60% energy from carbohydrates, less than 232m of dietary cholesterol, 20-35 gm of total fiber daily;Understanding of distribution of calorie intake throughout the day with the consumption of 4-5 meals/snacks    Improve shortness of breath with ADL's  Yes    Intervention  Provide education, individualized exercise plan and daily activity instruction to help decrease symptoms of SOB with activities of daily living.    Expected Outcomes  Short Term: Improve cardiorespiratory  fitness to achieve a reduction of symptoms when performing ADLs;Long Term: Be able to perform more ADLs without symptoms or delay the onset of symptoms    Lipids  Yes    Intervention  Provide education and support for participant on nutrition & aerobic/resistive exercise along with prescribed medications to achieve LDL <39m, HDL >473m    Expected Outcomes  Short Term: Participant states understanding of desired cholesterol values and is compliant with medications prescribed. Participant is following exercise prescription and nutrition guidelines.;Long Term: Cholesterol controlled with medications as prescribed, with individualized exercise RX and with personalized nutrition plan. Value goals: LDL < 7071mHDL > 40 mg.       Core Components/Risk Factors/Patient Goals Review:    Core Components/Risk Factors/Patient Goals at Discharge (Final Review):    ITP Comments: ITP Comments    Row Name 02/24/19 1042 02/27/19 1030 03/12/19 0601       ITP Comments  Virtual Orientation completed. Diagnosis can be found in CE 5/27. EP/RD orientation 8/27 at 9:30  Completed 6MWT, gym orientation, and RD evaluation. Initial ITP created and sent for review to Dr. MarEmily Filbertedical Director.  30 Day review. Continue with ITP unless directed changes per Medical Director review.        Comments:

## 2019-03-12 NOTE — Progress Notes (Signed)
Daily Session Note  Patient Details  Name: Jay Watkins MRN: [id number] Date of Birth: 1940/12/24 Referring Provider:     Pulmonary Rehab from 02/27/2019 in [facility] Cardiac and Pulmonary Rehab  Referring Provider  Ancil Linsey MD      Encounter Date: 03/12/2019  Check In: Session Check In - 03/12/19 0830      Check-In   Supervising physician immediately available to respond to emergencies  See telemetry face sheet for immediately available ER MD    Location  [facility]-Cardiac & Pulmonary Rehab    Staff Present  Heath Lark, RN, BSN, CCRP;Joseph Hood RCP,RRT,BSRT;Jessica [place], [place], [place], [place], CCET    Virtual Visit  No    Medication changes reported      No    Fall or balance concerns reported     No    Warm-up and Cool-down  Performed on first and last piece of equipment    Resistance Training Performed  Yes    VAD Patient?  No    PAD/SET Patient?  No      Pain Assessment   Currently in Pain?  No/denies          Social History   Tobacco Use  Smoking Status Never Smoker  Smokeless Tobacco Never Used    Goals Met:  Exercise tolerated well No report of cardiac concerns or symptoms Strength training completed today  Goals Unmet:  Not Applicable  Comments: Pt able to follow exercise prescription today without complaint.  Will continue to monitor for progression.    Dr. Emily Filbert is Medical Director for [place] and [facility].

## 2019-03-14 ENCOUNTER — Other Ambulatory Visit: Payer: Self-pay

## 2019-03-14 ENCOUNTER — Encounter: Payer: Medicare HMO | Admitting: *Deleted

## 2019-03-14 DIAGNOSIS — J449 Chronic obstructive pulmonary disease, unspecified: Secondary | ICD-10-CM

## 2019-03-14 NOTE — Progress Notes (Signed)
Daily Session Note  Patient Details  Name: Jay Watkins MRN: [id number] Date of Birth: [birth year] Referring Provider:     Pulmonary Rehab from 02/27/2019 in [facility] Cardiac and Pulmonary Rehab  Referring Provider  Ancil Linsey MD      Encounter Date: 03/14/2019  Check In: Session Check In - 03/14/19 0833      Check-In   Supervising physician immediately available to respond to emergencies  See telemetry face sheet for immediately available ER MD    Location  [facility]-Cardiac & Pulmonary Rehab    Staff Present  Heath Lark, RN, BSN, CCRP;Amanda Sommer, BA, ACSM CEP, Exercise Physiologist;Jessica [place], MA, RCEP, CCRP, CCET    Virtual Visit  No    Medication changes reported      No    Fall or balance concerns reported     No    Warm-up and Cool-down  Performed on first and last piece of equipment    Resistance Training Performed  Yes    VAD Patient?  No    PAD/SET Patient?  No      Pain Assessment   Currently in Pain?  No/denies          Social History   Tobacco Use  Smoking Status Never Smoker  Smokeless Tobacco Never Used    Goals Met:  Exercise tolerated well No report of cardiac concerns or symptoms  Goals Unmet:  Not Applicable  Comments: Pt able to follow exercise prescription today without complaint.  Will continue to monitor for progression.    Dr. Emily Filbert is Medical Director for [place] and [facility].

## 2019-03-17 ENCOUNTER — Encounter: Payer: Medicare HMO | Admitting: *Deleted

## 2019-03-17 ENCOUNTER — Other Ambulatory Visit: Payer: Self-pay

## 2019-03-17 DIAGNOSIS — J449 Chronic obstructive pulmonary disease, unspecified: Secondary | ICD-10-CM

## 2019-03-17 NOTE — Progress Notes (Signed)
Daily Session Note  Patient Details  Name: BENNIE CHIRICO MRN: [id number] Date of Birth: 12-30-1940 Referring Provider:     Pulmonary Rehab from 02/27/2019 in [facility] Cardiac and Pulmonary Rehab  Referring Provider  Ancil Linsey MD      Encounter Date: 03/17/2019  Check In: Session Check In - 03/17/19 0833      Check-In   Supervising physician immediately available to respond to emergencies  See telemetry face sheet for immediately available ER MD    Location  [facility]-Cardiac & Pulmonary Rehab    Staff Present  Heath Lark, RN, BSN, Laveda Norman, BS, ACSM CEP, Exercise Physiologist;Joseph Tessie Fass RCP,RRT,BSRT    Virtual Visit  No    Medication changes reported      No    Fall or balance concerns reported     No    Warm-up and Cool-down  Performed on first and last piece of equipment    Resistance Training Performed  Yes    VAD Patient?  No    PAD/SET Patient?  No      Pain Assessment   Currently in Pain?  No/denies          Social History   Tobacco Use  Smoking Status Never Smoker  Smokeless Tobacco Never Used    Goals Met:  Independence with exercise equipment Exercise tolerated well No report of cardiac concerns or symptoms  Goals Unmet:  Not Applicable  Comments: Pt able to follow exercise prescription today without complaint.  Will continue to monitor for progression.    Dr. Emily Filbert is Medical Director for [place] and [facility].

## 2019-03-19 ENCOUNTER — Other Ambulatory Visit: Payer: Self-pay

## 2019-03-19 ENCOUNTER — Encounter: Payer: Medicare HMO | Admitting: *Deleted

## 2019-03-19 DIAGNOSIS — J449 Chronic obstructive pulmonary disease, unspecified: Secondary | ICD-10-CM

## 2019-03-19 NOTE — Progress Notes (Signed)
Daily Session Note  Patient Details  Name: NAVARRO NINE MRN: [id number] Date of Birth: December 15, 1940 Referring Provider:     Pulmonary Rehab from 02/27/2019 in [facility] Cardiac and Pulmonary Rehab  Referring Provider  Ancil Linsey MD      Encounter Date: 03/19/2019  Check In: Session Check In - 03/19/19 0821      Check-In   Supervising physician immediately available to respond to emergencies  See telemetry face sheet for immediately available ER MD    Location  [facility]-Cardiac & Pulmonary Rehab    Staff Present  Alberteen Sam, MA, RCEP, CCRP, Kathyrn Drown, RN BSN;Joseph Lipscomb [place]    Virtual Visit  No    Medication changes reported      No    Fall or balance concerns reported     No    Warm-up and Cool-down  Performed on first and last piece of equipment    Resistance Training Performed  Yes    VAD Patient?  No    PAD/SET Patient?  No      Pain Assessment   Currently in Pain?  No/denies          Social History   Tobacco Use  Smoking Status Never Smoker  Smokeless Tobacco Never Used    Goals Met:  Proper associated with RPD/PD & O2 Sat Independence with exercise equipment Using PLB without cueing & demonstrates good technique Exercise tolerated well Strength training completed today  Goals Unmet:  Not Applicable  Comments: Pt able to follow exercise prescription today without complaint.  Will continue to monitor for progression.    Dr. Emily Filbert is Medical Director for [place] and [facility].

## 2019-03-20 ENCOUNTER — Other Ambulatory Visit: Payer: Self-pay

## 2019-03-20 DIAGNOSIS — J449 Chronic obstructive pulmonary disease, unspecified: Secondary | ICD-10-CM

## 2019-03-20 NOTE — Progress Notes (Signed)
Daily Session Note  Patient Details  Name: Jay Watkins MRN: [id number] Date of Birth: October 19, 1940 Referring Provider:     Pulmonary Rehab from 02/27/2019 in [facility] Cardiac and Pulmonary Rehab  Referring Provider  Ancil Linsey MD      Encounter Date: 03/20/2019  Check In: Session Check In - 03/20/19 0841      Check-In   Supervising physician immediately available to respond to emergencies  See telemetry face sheet for immediately available ER MD    Location  [facility]-Cardiac & Pulmonary Rehab    Staff Present  Vida Rigger RN, Vickki Hearing, BA, ACSM CEP, Exercise Physiologist;Jessica Luan Pulling, MA, RCEP, CCRP, CCET;Jeanna Durrell BS, Exercise Physiologist    Virtual Visit  No    Medication changes reported      No    Fall or balance concerns reported     No    Warm-up and Cool-down  Performed on first and last piece of equipment    Resistance Training Performed  Yes    VAD Patient?  No    PAD/SET Patient?  No      Pain Assessment   Currently in Pain?  No/denies    Multiple Pain Sites  No          Social History   Tobacco Use  Smoking Status Never Smoker  Smokeless Tobacco Never Used    Goals Met:  Proper associated with RPD/PD & O2 Sat Independence with exercise equipment Exercise tolerated well No report of cardiac concerns or symptoms Strength training completed today  Goals Unmet:  Not Applicable  Comments: Pt able to follow exercise prescription today without complaint.  Will continue to monitor for progression.   Dr. Emily Filbert is Medical Director for [place] and [facility].

## 2019-03-24 ENCOUNTER — Encounter: Payer: Medicare HMO | Admitting: *Deleted

## 2019-03-24 ENCOUNTER — Other Ambulatory Visit: Payer: Self-pay

## 2019-03-24 DIAGNOSIS — J449 Chronic obstructive pulmonary disease, unspecified: Secondary | ICD-10-CM | POA: Diagnosis not present

## 2019-03-24 NOTE — Progress Notes (Signed)
Daily Session Note  Patient Details  Name: Jay Watkins MRN: [id number] Date of Birth: 16-Jun-1941 Referring Provider:     Pulmonary Rehab from 02/27/2019 in [facility] Cardiac and Pulmonary Rehab  Referring Provider  Ancil Linsey MD      Encounter Date: 03/24/2019  Check In: Session Check In - 03/24/19 0830      Check-In   Supervising physician immediately available to respond to emergencies  See telemetry face sheet for immediately available ER MD    Location  [facility]-Cardiac & Pulmonary Rehab    Staff Present  Heath Lark, RN, BSN, Laveda Norman, BS, ACSM CEP, Exercise Physiologist;Joseph Tessie Fass RCP,RRT,BSRT    Virtual Visit  No    Medication changes reported      No    Fall or balance concerns reported     No    Warm-up and Cool-down  Performed on first and last piece of equipment    Resistance Training Performed  Yes    VAD Patient?  No    PAD/SET Patient?  No      Pain Assessment   Currently in Pain?  No/denies          Social History   Tobacco Use  Smoking Status Never Smoker  Smokeless Tobacco Never Used    Goals Met:  Independence with exercise equipment Exercise tolerated well No report of cardiac concerns or symptoms  Goals Unmet:  Not Applicable  Comments: Pt able to follow exercise prescription today without complaint.  Will continue to monitor for progression.    Dr. Emily Filbert is Medical Director for [place] and [facility].

## 2019-03-25 DIAGNOSIS — J449 Chronic obstructive pulmonary disease, unspecified: Secondary | ICD-10-CM

## 2019-03-25 NOTE — Progress Notes (Signed)
Pulmonary Individual Treatment Plan  Patient Details  Name: Jay Watkins MRN: [id number] Date of Birth: April 02, 1941 Referring Provider:     Pulmonary Rehab from 02/27/2019 in [facility] Cardiac and Pulmonary Rehab  Referring Provider  Ancil Linsey MD      Initial Encounter Date:    Pulmonary Rehab from 02/27/2019 in [facility] Cardiac and Pulmonary Rehab  Date  02/27/19      Visit Diagnosis: Chronic obstructive pulmonary disease, unspecified COPD type ([place])  Patient's Home Medications on Admission:  Current Outpatient Medications:  .  albuterol (PROVENTIL HFA;VENTOLIN HFA) 108 (90 Base) MCG/ACT inhaler, Inhale 2 puffs into the lungs every 6 (six) hours as needed. , Disp: , Rfl:  .  amLODipine (NORVASC) 2.5 MG tablet, Take 2.5 mg by mouth daily., Disp: , Rfl:  .  aspirin EC 81 MG tablet, Take 81 mg by mouth every Monday, Wednesday, and Friday at 8 PM., Disp: , Rfl:  .  atorvastatin (LIPITOR) 80 MG tablet, Take 80 mg by mouth at bedtime. , Disp: , Rfl:  .  cholestyramine (QUESTRAN) 4 g packet, Take 4 g by mouth daily., Disp: , Rfl:  .  clopidogrel (PLAVIX) 75 MG tablet, Take 75 mg by mouth daily., Disp: , Rfl:  .  dicyclomine (BENTYL) 10 MG capsule, Take by mouth 3 (three) times daily as needed (diarrhea). , Disp: , Rfl:  .  finasteride (PROSCAR) 5 MG tablet, Take 5 mg by mouth daily., Disp: , Rfl:  .  meclizine (ANTIVERT) 25 MG tablet, Take 25 mg by mouth 3 (three) times daily as needed for dizziness., Disp: , Rfl:  .  midodrine (PROAMATINE) 5 MG tablet, Take 5 mg by mouth 2 (two) times daily. , Disp: , Rfl:  .  mometasone (ELOCON) 0.1 % lotion, Apply 1 application topically daily as needed (for itching in ears)., Disp: , Rfl:  .  nortriptyline (PAMELOR) 10 MG capsule, Take 10 mg by mouth at bedtime., Disp: , Rfl:  .  pantoprazole (PROTONIX) 40 MG tablet, Take 40 mg by mouth 2 (two) times daily. , Disp: , Rfl:  .  polyethylene glycol-electrolytes (NULYTELY/GOLYTELY) 420 g solution, Take as  directed for colonic prep., Disp: , Rfl:  .  senna-docusate (SENOKOT-S) 8.6-50 MG tablet, Take 1 tablet by mouth at bedtime as needed for mild constipation. (Patient not taking: Reported on 08/29/2018), Disp: , Rfl:  .  tizanidine (ZANAFLEX) 2 MG capsule, Take 2 mg by mouth 2 (two) times daily as needed for muscle spasms. , Disp: , Rfl:  .  umeclidinium-vilanterol (ANORO ELLIPTA) 62.5-25 MCG/INH AEPB, Inhale 1 puff into the lungs daily., Disp: , Rfl:  .  vitamin B-12 (CYANOCOBALAMIN) 500 MCG tablet, Take 500 mcg by mouth daily., Disp: , Rfl:   Past Medical History: Past Medical History:  Diagnosis Date  . Asbestos exposure   . B12 deficiency   . BPH with urinary obstruction    lower urinary tract symptoms  . Carpal tunnel syndrome   . COPD, moderate ([place])   . Difficulty walking   . Diverticulosis   . Dysphagia   . Dyspnea    unspecified  . Edema   . Elevated PSA   . GERD (gastroesophageal reflux disease)   . Gouty arthropathy   . History of kidney stones   . Hyperlipidemia   . Hypotension   . Memory loss   . Neuropathy    idopathic peripheral  . Onychomycosis   . Orthostatic hypotension   . Patent foramen ovale   .  Pre-diabetes   . Presence of permanent cardiac pacemaker   . Proteinuria   . Renal calculus   . 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 Scleroderma ([place])   . Sleep apnea   . Spondylosis of cervical spine   . Stasis dermatitis   . Stroke ([place])   . 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 Urinary frequency   . Varicose veins of leg with edema     Tobacco Use: Social History   Tobacco Use  Smoking Status Never Smoker  Smokeless Tobacco Never Used    Labs: Recent Review Flowsheet Data    There is no flowsheet data to display.       Pulmonary Assessment Scores: Pulmonary Assessment Scores    Row Name 02/27/19 1047         ADL UCSD   ADL Phase  Entry     SOB Score total  76     Rest  0     Walk  4     Stairs  5     Bath  5     Dress  3     Shop  4        CAT Score   CAT Score  17       mMRC Score   mMRC Score  3        UCSD: Self-administered rating of dyspnea associated with activities of daily living (ADLs) 6-point scale (0 = "not at all" to 5 = "maximal or unable to do because of breathlessness")  Scoring Scores range from 0 to 120.  Minimally important difference is 5 units  CAT: CAT can identify the health impairment of COPD patients and is better correlated with disease progression.  CAT has a scoring range of zero to 40. The CAT score is classified into four groups of low (less than 10), medium (10 - 20), high (21-30) and very high (31-40) based on the impact level of disease on health status. A CAT score over 10 suggests significant symptoms.  A worsening CAT score could be explained by an exacerbation, poor medication adherence, poor inhaler technique, or progression of COPD or comorbid conditions.  CAT MCID is 2 points  mMRC: mMRC (Modified Medical Research Council) Dyspnea Scale is used to assess the degree of baseline functional disability in patients of respiratory disease due to dyspnea. No minimal important difference is established. A decrease in score of 1 point or greater is considered a positive change.   Pulmonary Function Assessment:   Exercise Target Goals: Exercise Program Goal: Individual exercise prescription set using results from initial 6 min walk test and THRR while considering  patient's activity barriers and safety.   Exercise Prescription Goal: Initial exercise prescription builds to 30-45 minutes a day of aerobic activity, 2-3 days per week.  Home exercise guidelines will be given to patient during program as part of exercise prescription that the participant will acknowledge.  Activity Barriers & Risk Stratification: Activity Barriers & Cardiac Risk Stratification - 02/27/19 1034      Activity Barriers & Cardiac Risk Stratification   Activity Barriers  Back Problems;Other (comment);Joint  Problems;Muscular Weakness;Deconditioning;Shortness of Breath;Balance Concerns    Comments  occ back pain and get injections, neuropathy, ankle fused, previous CVA       6 Minute Walk: 6 Minute Walk    Row Name 02/27/19 1030         6 Minute Walk   Phase  Initial  Distance  780 feet     Walk Time  4.25 minutes     # of Rest Breaks  0 test terminated at 4:15 due to desaturation     MPH  2.09     METS  1.24     RPE  15     Perceived Dyspnea   2     VO2 Peak  4.36     Symptoms  Yes (comment)     Comments  SOB     Resting HR  71 bpm     Resting BP  126/64     Resting Oxygen Saturation   99 %     Exercise Oxygen Saturation  during 6 min walk  79 %     Max Ex. HR  93 bpm     Max Ex. BP  144/74     2 Minute Post BP  126/60       Interval HR   1 Minute HR  90     2 Minute HR  82     3 Minute HR  81     4 Minute HR  79     2 Minute Post HR  66     Interval Heart Rate?  Yes       Interval Oxygen   Interval Oxygen?  Yes     Baseline Oxygen Saturation %  99 %     1 Minute Oxygen Saturation %  90 %     1 Minute Liters of Oxygen  3 L pulsed     2 Minute Oxygen Saturation %  82 %     2 Minute Liters of Oxygen  3 L     3 Minute Oxygen Saturation %  81 %     3 Minute Liters of Oxygen  3 L     4 Minute Oxygen Saturation %  79 % stopped at 4:15     4 Minute Liters of Oxygen  3 L     2 Minute Post Oxygen Saturation %  97 %     2 Minute Post Liters of Oxygen  3 L continuous       Oxygen Initial Assessment: Oxygen Initial Assessment - 02/27/19 1039      Home Oxygen   Home Oxygen Device  Home Concentrator    Sleep Oxygen Prescription  CPAP;Continuous    Liters per minute  3    Home Exercise Oxygen Prescription  Pulsed    Liters per minute  3    Home at Rest Exercise Oxygen Prescription  Continuous    Liters per minute  3    Compliance with Home Oxygen Use  Yes      Initial 6 min Walk   Oxygen Used  Pulsed;Portable Concentrator    Liters per minute  3      Program  Oxygen Prescription   Program Oxygen Prescription  Continuous;E-Tanks    Liters per minute  3      Intervention   Short Term Goals  To learn and understand importance of maintaining oxygen saturations>88%;To learn and demonstrate proper pursed lip breathing techniques or other breathing techniques.;To learn and demonstrate proper use of respiratory medications;To learn and understand importance of monitoring SPO2 with pulse oximeter and demonstrate accurate use of the pulse oximeter.;To learn and exhibit compliance with exercise, home and travel O2 prescription    Long  Term Goals  Verbalizes importance of monitoring SPO2 with pulse oximeter and return demonstration;Exhibits proper breathing techniques,  such as pursed lip breathing or other method taught during program session;Compliance with respiratory medication;Maintenance of O2 saturations>88%;Exhibits compliance with exercise, home and travel O2 prescription;Demonstrates proper use of MDI's       Oxygen Re-Evaluation: Oxygen Re-Evaluation    Row Name 03/06/19 0844 03/17/19 0849           Program Oxygen Prescription   Program Oxygen Prescription  Continuous;E-Tanks  Continuous;E-Tanks      Liters per minute  3  3        Home Oxygen   Home Oxygen Device  Home Concentrator  Home Concentrator      Sleep Oxygen Prescription  CPAP;Continuous  CPAP;Continuous      Liters per minute  3  3      Home Exercise Oxygen Prescription  Pulsed  Pulsed      Liters per minute  3  3      Home at Rest Exercise Oxygen Prescription  Continuous  Continuous      Liters per minute  3  3      Compliance with Home Oxygen Use  Yes  Yes        Goals/Expected Outcomes   Short Term Goals  To learn and understand importance of maintaining oxygen saturations>88%;To learn and demonstrate proper pursed lip breathing techniques or other breathing techniques.;To learn and understand importance of monitoring SPO2 with pulse oximeter and demonstrate accurate use of  the pulse oximeter.  To learn and understand importance of maintaining oxygen saturations>88%;To learn and demonstrate proper pursed lip breathing techniques or other breathing techniques.;To learn and understand importance of monitoring SPO2 with pulse oximeter and demonstrate accurate use of the pulse oximeter.      Long  Term Goals  Verbalizes importance of monitoring SPO2 with pulse oximeter and return demonstration;Exhibits proper breathing techniques, such as pursed lip breathing or other method taught during program session;Maintenance of O2 saturations>88%  Verbalizes importance of monitoring SPO2 with pulse oximeter and return demonstration;Exhibits proper breathing techniques, such as pursed lip breathing or other method taught during program session;Maintenance of O2 saturations>88%      Comments  Reviewed PLB technique with pt.  Talked about how it work and it's important to maintaining his exercise saturations.  Jay Watkins has been having some episodes of presyncope at home that seem to related to his oxygen.  Last week after exercise his sats dropped at home and he almost went out and when fully back he was blue around the mouth and cheeks.  He has a follow up today with his pulmonologist.  He has been compliant with his oxgen use. He is also good about using his PLB.      Goals/Expected Outcomes  Short: Become more profiecient at using PLB.   Long: Become independent at using PLB.  Short: Meet with pulmonologist today.  Long: Continued compliance; figure out what is going on with his presyncope.         Oxygen Discharge (Final Oxygen Re-Evaluation): Oxygen Re-Evaluation - 03/17/19 0849      Program Oxygen Prescription   Program Oxygen Prescription  Continuous;E-Tanks    Liters per minute  3      Home Oxygen   Home Oxygen Device  Home Concentrator    Sleep Oxygen Prescription  CPAP;Continuous    Liters per minute  3    Home Exercise Oxygen Prescription  Pulsed    Liters per minute  3     Home at Rest Exercise Oxygen Prescription  Continuous  Liters per minute  3    Compliance with Home Oxygen Use  Yes      Goals/Expected Outcomes   Short Term Goals  To learn and understand importance of maintaining oxygen saturations>88%;To learn and demonstrate proper pursed lip breathing techniques or other breathing techniques.;To learn and understand importance of monitoring SPO2 with pulse oximeter and demonstrate accurate use of the pulse oximeter.    Long  Term Goals  Verbalizes importance of monitoring SPO2 with pulse oximeter and return demonstration;Exhibits proper breathing techniques, such as pursed lip breathing or other method taught during program session;Maintenance of O2 saturations>88%    Comments  Jay Watkins has been having some episodes of presyncope at home that seem to related to his oxygen.  Last week after exercise his sats dropped at home and he almost went out and when fully back he was blue around the mouth and cheeks.  He has a follow up today with his pulmonologist.  He has been compliant with his oxgen use. He is also good about using his PLB.    Goals/Expected Outcomes  Short: Meet with pulmonologist today.  Long: Continued compliance; figure out what is going on with his presyncope.       Initial Exercise Prescription: Initial Exercise Prescription - 02/27/19 1000      Date of Initial Exercise RX and Referring Provider   Date  02/27/19    Referring Provider  Ancil Linsey MD      Oxygen   Oxygen  Continuous    Liters  3      Recumbant Bike   Level  1    RPM  50    Watts  15    Minutes  15    METs  1.2      NuStep   Level  1    SPM  80    Minutes  15    METs  1.2      REL-XR   Level  1    Speed  50    Minutes  15    METs  1.2      Prescription Details   Frequency (times per week)  3    Duration  Progress to 30 minutes of continuous aerobic without signs/symptoms of physical distress      Intensity   THRR 40-80% of Max Heartrate  100-129     Ratings of Perceived Exertion  11-13    Perceived Dyspnea  0-4      Progression   Progression  Continue to progress workloads to maintain intensity without signs/symptoms of physical distress.      Resistance Training   Training Prescription  Yes    Weight  3 lbs    Reps  10-15       Perform Capillary Blood Glucose checks as needed.  Exercise Prescription Changes: Exercise Prescription Changes    Row Name 02/27/19 1000 03/14/19 1300           Response to Exercise   Blood Pressure (Admit)  126/64  126/62      Blood Pressure (Exercise)  144/74  128/62      Blood Pressure (Exit)  126/60  100/62      Heart Rate (Admit)  71 bpm  84 bpm      Heart Rate (Exercise)  93 bpm  100 bpm      Heart Rate (Exit)  66 bpm  90 bpm      Oxygen Saturation (Admit)  99 %  99 %  Oxygen Saturation (Exercise)  79 %  96 %      Oxygen Saturation (Exit)  97 %  99 %      Rating of Perceived Exertion (Exercise)  15  12      Perceived Dyspnea (Exercise)  2  3      Symptoms  SOB  -      Comments  walk test results  -        Resistance Training   Training Prescription  -  Yes      Weight  -  3 lb      Reps  -  10-15        Oxygen   Oxygen  -  Continuous      Liters  -  4        Treadmill   MPH  -  1.3      Minutes  -  15      METs  -  1.99        T5 Nustep   Level  -  1      SPM  -  80      Minutes  -  15      METs  -  1.7         Exercise Comments: Exercise Comments    Row Name 03/06/19 [phone]           Exercise Comments  First full day of exercise!  Patient was oriented to gym and equipment including functions, settings, policies, and procedures.  Patient's individual exercise prescription and treatment plan were reviewed.  All starting workloads were established based on the results of the 6 minute walk test done at initial orientation visit.  The plan for exercise progression was also introduced and progression will be customized based on patient's performance and goals.           Exercise Goals and Review: Exercise Goals    Row Name 02/27/19 1037             Exercise Goals   Increase Physical Activity  Yes       Intervention  Provide advice, education, support and counseling about physical activity/exercise needs.;Develop an individualized exercise prescription for aerobic and resistive training based on initial evaluation findings, risk stratification, comorbidities and participant's personal goals.       Expected Outcomes  Short Term: Attend rehab on a regular basis to increase amount of physical activity.;Long Term: Add in home exercise to make exercise part of routine and to increase amount of physical activity.;Long Term: Exercising regularly at least 3-5 days a week.       Increase Strength and Stamina  Yes       Intervention  Provide advice, education, support and counseling about physical activity/exercise needs.;Develop an individualized exercise prescription for aerobic and resistive training based on initial evaluation findings, risk stratification, comorbidities and participant's personal goals.       Expected Outcomes  Short Term: Increase workloads from initial exercise prescription for resistance, speed, and METs.;Short Term: Perform resistance training exercises routinely during rehab and add in resistance training at home;Long Term: Improve cardiorespiratory fitness, muscular endurance and strength as measured by increased METs and functional capacity (6MWT)       Able to understand and use rate of perceived exertion (RPE) scale  Yes       Intervention  Provide education and explanation on how to use RPE scale       Expected Outcomes  Short Term: Able to use RPE daily in rehab to express subjective intensity level;Long Term:  Able to use RPE to guide intensity level when exercising independently       Able to understand and use Dyspnea scale  Yes       Intervention  Provide education and explanation on how to use Dyspnea scale       Expected  Outcomes  Short Term: Able to use Dyspnea scale daily in rehab to express subjective sense of shortness of breath during exertion;Long Term: Able to use Dyspnea scale to guide intensity level when exercising independently       Knowledge and understanding of Target Heart Rate Range (THRR)  Yes       Intervention  Provide education and explanation of THRR including how the numbers were predicted and where they are located for reference       Expected Outcomes  Short Term: Able to state/look up THRR;Short Term: Able to use daily as guideline for intensity in rehab;Long Term: Able to use THRR to govern intensity when exercising independently       Able to check pulse independently  Yes       Intervention  Provide education and demonstration on how to check pulse in carotid and radial arteries.;Review the importance of being able to check your own pulse for safety during independent exercise       Expected Outcomes  Short Term: Able to explain why pulse checking is important during independent exercise;Long Term: Able to check pulse independently and accurately       Understanding of Exercise Prescription  Yes       Intervention  Provide education, explanation, and written materials on patient's individual exercise prescription       Expected Outcomes  Short Term: Able to explain program exercise prescription;Long Term: Able to explain home exercise prescription to exercise independently          Exercise Goals Re-Evaluation : Exercise Goals Re-Evaluation    Row Name 03/06/19 0842 03/14/19 1307 03/17/19 0852         Exercise Goal Re-Evaluation   Exercise Goals Review  Increase Physical Activity;Increase Strength and Stamina;Able to understand and use rate of perceived exertion (RPE) scale;Knowledge and understanding of Target Heart Rate Range (THRR);Understanding of Exercise Prescription  Increase Physical Activity;Increase Strength and Stamina;Able to understand and use rate of perceived exertion  (RPE) scale;Knowledge and understanding of Target Heart Rate Range (THRR);Understanding of Exercise Prescription  Increase Physical Activity;Increase Strength and Stamina;Understanding of Exercise Prescription     Comments  Reviewed RPE scale, THR and program prescription with pt today.  Pt voiced understanding and was given a copy of goals to take home.  Today was second session for this patient.  He will need to attend consistently to progress  Jay Watkins is off to a good start with rehab. He is feeling pretty good with exercise and is tired when he leaves class.  He has already noticed that his legs are starting to get stronger. Reviewed home exercise with pt today.  Pt plans to walk at home for exercise.  Reviewed THR, pulse, RPE, sign and symptoms, NTG use, and when to call 911 or MD.  Also discussed weather considerations and indoor options.  Pt voiced understanding.     Expected Outcomes  Short: Use RPE daily to regulate intensity. Long: Follow program prescription in THR.  Short - attend 2-3 times per week Long - increase stamina  Short: Start walking more at home.  Long: Continue to improve stamina.        Discharge Exercise Prescription (Final Exercise Prescription Changes): Exercise Prescription Changes - 03/14/19 1300      Response to Exercise   Blood Pressure (Admit)  126/62    Blood Pressure (Exercise)  128/62    Blood Pressure (Exit)  100/62    Heart Rate (Admit)  84 bpm    Heart Rate (Exercise)  100 bpm    Heart Rate (Exit)  90 bpm    Oxygen Saturation (Admit)  99 %    Oxygen Saturation (Exercise)  96 %    Oxygen Saturation (Exit)  99 %    Rating of Perceived Exertion (Exercise)  12    Perceived Dyspnea (Exercise)  3      Resistance Training   Training Prescription  Yes    Weight  3 lb    Reps  10-15      Oxygen   Oxygen  Continuous    Liters  4      Treadmill   MPH  1.3    Minutes  15    METs  1.99      T5 Nustep   Level  1    SPM  80    Minutes  15    METs  1.7        Nutrition:  Target Goals: Understanding of nutrition guidelines, daily intake of sodium '1500mg'$ , cholesterol '200mg'$ , calories 30% from fat and 7% or less from saturated fats, daily to have 5 or more servings of fruits and vegetables.  Biometrics: Pre Biometrics - 02/27/19 1038      Pre Biometrics   Height  5' 9.9" (1.775 m)    Weight  244 lb 14.4 oz (111.1 kg)    BMI (Calculated)  35.26    Single Leg Stand  5.03 seconds        Nutrition Therapy Plan and Nutrition Goals: Nutrition Therapy & Goals - 02/27/19 1035      Nutrition Therapy   Diet  Low Na, HH diet    Protein (specify units)  90g    Fiber  30 grams    Whole Grain Foods  3 servings    Saturated Fats  12 max. grams    Fruits and Vegetables  5 servings/day    Sodium  1.5 grams      Personal Nutrition Goals   Nutrition Goal  ST: get results from scopy procedures and then we can discuss healthy snacks LT: get function and mobility back    Comments  Pt wife reports pt will have scrambled eggs with whole grain english muffin for breakfast, L: lean meat (usually chicken, sometimes pork and occassionally hamburgers), salads with low-fat dressing and roasted vegetables and potatoes with olive oil and herbs, D: honey nut cheerios with skim milk. Pt will drink coffee and water during the day. Pt is mechanically eating as has very low appetite. Pt will have mcdonalds 1x/week. Pt reports not being able to eat certain fruits or yogurt - having scopy to see what is going one. pt reports loving beans.      Intervention Plan   Intervention  Prescribe, educate and counsel regarding individualized specific dietary modifications aiming towards targeted core components such as weight, hypertension, lipid management, diabetes, heart failure and other comorbidities.;Nutrition handout(s) given to patient.    Expected Outcomes  Short Term Goal: Understand basic principles of dietary content, such as calories, fat, sodium, cholesterol and  nutrients.;Short Term Goal: A plan  has been developed with personal nutrition goals set during dietitian appointment.;Long Term Goal: Adherence to prescribed nutrition plan.       Nutrition Assessments: Nutrition Assessments - 02/27/19 1033      MEDFICTS Scores   Pre Score  23       Nutrition Goals Re-Evaluation: Nutrition Goals Re-Evaluation    [place] Name 03/20/19 0948             Goals   Nutrition Goal  ST: get results from scopy procedures and then we can discuss healthy snacks LT: get function and mobility back       Comment  Pt wife reports pt has diverticulitis. Discussed MNT fo this condition. Pt and pt wife report pt is eating the same since we first spoke and are continuing to eat healthy       Expected Outcome  Continue HH eating          Nutrition Goals Discharge (Final Nutrition Goals Re-Evaluation): Nutrition Goals Re-Evaluation - 03/20/19 0948      Goals   Nutrition Goal  ST: get results from scopy procedures and then we can discuss healthy snacks LT: get function and mobility back    Comment  Pt wife reports pt has diverticulitis. Discussed MNT fo this condition. Pt and pt wife report pt is eating the same since we first spoke and are continuing to eat healthy    Expected Outcome  Continue HH eating       Psychosocial: Target Goals: Acknowledge presence or absence of significant depression and/or stress, maximize coping skills, provide positive support system. Participant is able to verbalize types and ability to use techniques and skills needed for reducing stress and depression.   Initial Review & Psychosocial Screening: Initial Psych Review & Screening - 02/24/19 1044      Initial Review   Current issues with  None Identified      Family Dynamics   Good Support System?  Yes      Barriers   Psychosocial barriers to participate in program  There are no identifiable barriers or psychosocial needs.;The patient should benefit from training in stress  management and relaxation.      Screening Interventions   Interventions  Encouraged to exercise;To provide support and resources with identified psychosocial needs;Provide feedback about the scores to participant    Expected Outcomes  Short Term goal: Utilizing psychosocial counselor, staff and physician to assist with identification of specific Stressors or current issues interfering with healing process. Setting desired goal for each stressor or current issue identified.;Long Term Goal: Stressors or current issues are controlled or eliminated.;Short Term goal: Identification and review with participant of any Quality of Life or Depression concerns found by scoring the questionnaire.;Long Term goal: The participant improves quality of Life and PHQ9 Scores as seen by post scores and/or verbalization of changes       Quality of Life Scores:  Scores of 19 and below usually indicate a poorer quality of life in these areas.  A difference of  2-3 points is a clinically meaningful difference.  A difference of 2-3 points in the total score of the Quality of Life Index has been associated with significant improvement in overall quality of life, self-image, physical symptoms, and general health in studies assessing change in quality of life.  PHQ-9: Recent Review Flowsheet Data    Depression screen [facility] 2/9 02/27/2019   Decreased Interest 2   Down, Depressed, Hopeless 0   PHQ - 2 Score 2  Altered sleeping 0   Tired, decreased energy 0   Change in appetite 1   Feeling bad or failure about yourself  0   Trouble concentrating 0   Moving slowly or fidgety/restless 0   Suicidal thoughts 0   PHQ-9 Score 3   Difficult doing work/chores Not difficult at all     Interpretation of Total Score  Total Score Depression Severity:  1-4 = Minimal depression, 5-9 = Mild depression, 10-14 = Moderate depression, 15-19 = Moderately severe depression, 20-27 = Severe depression   Psychosocial Evaluation and  Intervention:   Psychosocial Re-Evaluation: Psychosocial Re-Evaluation    [place] Name 03/17/19 [phone]             Psychosocial Re-Evaluation   Current issues with  Current Stress Concerns       Comments  Jay Watkins is doing well in rehab, His biggest stressor is his health which he is working on.  He sleeps well.  He has a good support system in his wife and family.  They all had a little scare last week with his episode but he is going to doctor today to try to figure it out.       Expected Outcomes  Short: Talk to doctor.  Long: Continue to stay positive.       Interventions  Encouraged to attend Pulmonary Rehabilitation for the exercise       Continue Psychosocial Services   Follow up required by staff         Initial Review   Source of Stress Concerns  Unable to participate in former interests or hobbies          Psychosocial Discharge (Final Psychosocial Re-Evaluation): Psychosocial Re-Evaluation - 03/17/19 0856      Psychosocial Re-Evaluation   Current issues with  Current Stress Concerns    Comments  Jay Watkins is doing well in rehab, His biggest stressor is his health which he is working on.  He sleeps well.  He has a good support system in his wife and family.  They all had a little scare last week with his episode but he is going to doctor today to try to figure it out.    Expected Outcomes  Short: Talk to doctor.  Long: Continue to stay positive.    Interventions  Encouraged to attend Pulmonary Rehabilitation for the exercise    Continue Psychosocial Services   Follow up required by staff      Initial Review   Source of Stress Concerns  Unable to participate in former interests or hobbies       Education: Education Goals: Education classes will be provided on a weekly basis, covering required topics. Participant will state understanding/return demonstration of topics presented.  Learning Barriers/Preferences: Learning Barriers/Preferences - 02/24/19 1038      Learning  Barriers/Preferences   Learning Barriers  Hearing    Learning Preferences  Individual Instruction       Education Topics:  Initial Evaluation Education: - Verbal, written and demonstration of respiratory meds, oximetry and breathing techniques. Instruction on use of nebulizers and MDIs and importance of monitoring MDI activations.   Pulmonary Rehab from 02/27/2019 in [facility] Cardiac and Pulmonary Rehab  Date  02/27/19  Educator  [facility]  Instruction Review Code  1- Verbalizes Understanding      General Nutrition Guidelines/Fats and Fiber: -Group instruction provided by verbal, written material, models and posters to present the general guidelines for heart healthy nutrition. Gives an explanation and review of dietary fats and  fiber.   Controlling Sodium/Reading Food Labels: -Group verbal and written material supporting the discussion of sodium use in heart healthy nutrition. Review and explanation with models, verbal and written materials for utilization of the food label.   Exercise Physiology & General Exercise Guidelines: - Group verbal and written instruction with models to review the exercise physiology of the cardiovascular system and associated critical values. Provides general exercise guidelines with specific guidelines to those with heart or lung disease.    Aerobic Exercise & Resistance Training: - Gives group verbal and written instruction on the various components of exercise. Focuses on aerobic and resistive training programs and the benefits of this training and how to safely progress through these programs.   Flexibility, Balance, Mind/Body Relaxation: Provides group verbal/written instruction on the benefits of flexibility and balance training, including mind/body exercise modes such as yoga, pilates and tai chi.  Demonstration and skill practice provided.   Stress and Anxiety: - Provides group verbal and written instruction about the health risks of elevated stress and  causes of high stress.  Discuss the correlation between heart/lung disease and anxiety and treatment options. Review healthy ways to manage with stress and anxiety.   Depression: - Provides group verbal and written instruction on the correlation between heart/lung disease and depressed mood, treatment options, and the stigmas associated with seeking treatment.   Exercise & Equipment Safety: - Individual verbal instruction and demonstration of equipment use and safety with use of the equipment.   Pulmonary Rehab from 02/27/2019 in [facility] Cardiac and Pulmonary Rehab  Date  02/27/19  Educator  [facility]  Instruction Review Code  1- Verbalizes Understanding      Infection Prevention: - Provides verbal and written material to individual with discussion of infection control including proper hand washing and proper equipment cleaning during exercise session.   Pulmonary Rehab from 02/27/2019 in [facility] Cardiac and Pulmonary Rehab  Date  02/27/19  Educator  [facility]  Instruction Review Code  1- Verbalizes Understanding      Falls Prevention: - Provides verbal and written material to individual with discussion of falls prevention and safety.   Pulmonary Rehab from 02/27/2019 in [facility] Cardiac and Pulmonary Rehab  Date  02/27/19  Educator  [facility]  Instruction Review Code  1- Verbalizes Understanding      Diabetes: - Individual verbal and written instruction to review signs/symptoms of diabetes, desired ranges of glucose level fasting, after meals and with exercise. Advice that pre and post exercise glucose checks will be done for 3 sessions at entry of program.   Chronic Lung Diseases: - Group verbal and written instruction to review updates, respiratory medications, advancements in procedures and treatments. Discuss use of supplemental oxygen including available portable oxygen systems, continuous and intermittent flow rates, concentrators, personal use and safety guidelines. Review proper use of inhaler and  spacers. Provide informative websites for self-education.    Energy Conservation: - Provide group verbal and written instruction for methods to conserve energy, plan and organize activities. Instruct on pacing techniques, use of adaptive equipment and posture/positioning to relieve shortness of breath.   Triggers and Exacerbations: - Group verbal and written instruction to review types of environmental triggers and ways to prevent exacerbations. Discuss weather changes, air quality and the benefits of nasal washing. Review warning signs and symptoms to help prevent infections. Discuss techniques for effective airway clearance, coughing, and vibrations.   AED/CPR: - Group verbal and written instruction with the use of models to demonstrate the basic use of the AED  with the basic ABC's of resuscitation.   Anatomy and Physiology of the Lungs: - Group verbal and written instruction with the use of models to provide basic lung anatomy and physiology related to function, structure and complications of lung disease.   Anatomy & Physiology of the Heart: - Group verbal and written instruction and models provide basic cardiac anatomy and physiology, with the coronary electrical and arterial systems. Review of Valvular disease and Heart Failure   Cardiac Medications: - Group verbal and written instruction to review commonly prescribed medications for heart disease. Reviews the medication, class of the drug, and side effects.   Know Your Numbers and Risk Factors: -Group verbal and written instruction about important numbers in your health.  Discussion of what are risk factors and how they play a role in the disease process.  Review of Cholesterol, Blood Pressure, Diabetes, and BMI and the role they play in your overall health.   Sleep Hygiene: -Provides group verbal and written instruction about how sleep can affect your health.  Define sleep hygiene, discuss sleep cycles and impact of sleep  habits. Review good sleep hygiene tips.    Other: -Provides group and verbal instruction on various topics (see comments)    Knowledge Questionnaire Score: Knowledge Questionnaire Score - 02/27/19 1048      Knowledge Questionnaire Score   Pre Score  15/18 Education Focus: Oxygen Safety, MDIs        Core Components/Risk Factors/Patient Goals at Admission: Personal Goals and Risk Factors at Admission - 02/27/19 1038      Core Components/Risk Factors/Patient Goals on Admission    Weight Management  Yes;Obesity;Weight Loss    Intervention  Weight Management: Develop a combined nutrition and exercise program designed to reach desired caloric intake, while maintaining appropriate intake of nutrient and fiber, sodium and fats, and appropriate energy expenditure required for the weight goal.;Weight Management: Provide education and appropriate resources to help participant work on and attain dietary goals.;Weight Management/Obesity: Establish reasonable short term and long term weight goals.;Obesity: Provide education and appropriate resources to help participant work on and attain dietary goals.    Admit Weight  244 lb 14.4 oz (111.1 kg)    Goal Weight: Short Term  239 lb (108.4 kg)    Goal Weight: Long Term  230 lb (104.3 kg)    Expected Outcomes  Short Term: Continue to assess and modify interventions until short term weight is achieved;Long Term: Adherence to nutrition and physical activity/exercise program aimed toward attainment of established weight goal;Weight Loss: Understanding of general recommendations for a balanced deficit meal plan, which promotes 1-2 lb weight loss per week and includes a negative energy balance of [phone] kcal/d;Understanding recommendations for meals to include 15-35% energy as protein, 25-35% energy from fat, 35-60% energy from carbohydrates, less than '200mg'$  of dietary cholesterol, 20-35 gm of total fiber daily;Understanding of distribution of calorie intake  throughout the day with the consumption of 4-5 meals/snacks    Improve shortness of breath with ADL's  Yes    Intervention  Provide education, individualized exercise plan and daily activity instruction to help decrease symptoms of SOB with activities of daily living.    Expected Outcomes  Short Term: Improve cardiorespiratory fitness to achieve a reduction of symptoms when performing ADLs;Long Term: Be able to perform more ADLs without symptoms or delay the onset of symptoms    Lipids  Yes    Intervention  Provide education and support for participant on nutrition & aerobic/resistive exercise along with prescribed medications  to achieve LDL '70mg'$ , HDL >'40mg'$ .    Expected Outcomes  Short Term: Participant states understanding of desired cholesterol values and is compliant with medications prescribed. Participant is following exercise prescription and nutrition guidelines.;Long Term: Cholesterol controlled with medications as prescribed, with individualized exercise RX and with personalized nutrition plan. Value goals: LDL < '70mg'$ , HDL > 40 mg.       Core Components/Risk Factors/Patient Goals Review:  Goals and Risk Factor Review    Row Name 03/17/19 0858             Core Components/Risk Factors/Patient Goals Review   Personal Goals Review  Weight Management/Obesity;Improve shortness of breath with ADL's;Lipids       Review  Jay Watkins has already started to lose some weight.  He has noticed that his legs are getting stronger, but breathing still needs more.  He is doing well on his medications.  They have not checked pressures at home recently, but they have good in class.  We talked about gettting back into the habit of checking his pressures.       Expected Outcomes  Short: Continue to feel better and start monitoring pressures.  Long: Continue to work on weight loss.          Core Components/Risk Factors/Patient Goals at Discharge (Final Review):  Goals and Risk Factor Review - 03/17/19 0858       Core Components/Risk Factors/Patient Goals Review   Personal Goals Review  Weight Management/Obesity;Improve shortness of breath with ADL's;Lipids    Review  Jay Watkins has already started to lose some weight.  He has noticed that his legs are getting stronger, but breathing still needs more.  He is doing well on his medications.  They have not checked pressures at home recently, but they have good in class.  We talked about gettting back into the habit of checking his pressures.    Expected Outcomes  Short: Continue to feel better and start monitoring pressures.  Long: Continue to work on weight loss.       ITP Comments: ITP Comments    Row Name 02/24/19 1042 02/27/19 1030 03/12/19 0601 03/19/19 0857 03/25/19 1311   ITP Comments  Virtual Orientation completed. Diagnosis can be found in CE 5/27. EP/RD orientation 8/27 at 9:30  Completed 6MWT, gym orientation, and RD evaluation. Initial ITP created and sent for review to Dr. Emily Filbert, Medical Director.  30 Day review. Continue with ITP unless directed changes per Medical Director review.  Pt is wearing 48-hour holter monitor in preparation for MD appointment Monday.  Patient states that his Co-Pay is too much on his finances at this time. Patient wishes to be discharged. Discharge ITP sent to Dr. York Ram of [place].      Comments: Discharge ITP

## 2019-03-25 NOTE — Progress Notes (Signed)
Discharge Progress Report  Patient Details  Name: Jay Watkins MRN: [id number] Date of Birth: 1941-05-17 Referring Provider:     Pulmonary Rehab from 02/27/2019 in [facility]  Referring Provider  Ancil Linsey MD       Number of Visits: 8/36  Reason for Discharge:  Early Exit:  Personal  Smoking History:  Social History   Tobacco Use  Smoking Status Never Smoker  Smokeless Tobacco Never Used    Diagnosis:  Chronic obstructive pulmonary disease, unspecified COPD type ([place])  ADL UCSD: Pulmonary Assessment Scores    Row Name 02/27/19 1047         ADL UCSD   ADL Phase  Entry     SOB Score total  76     Rest  0     Walk  4     Stairs  5     Bath  5     Dress  3     Shop  4       CAT Score   CAT Score  17       mMRC Score   mMRC Score  3        Initial Exercise Prescription: Initial Exercise Prescription - 02/27/19 1000      Date of Initial Exercise RX and Referring Provider   Date  02/27/19    Referring Provider  Ancil Linsey MD      Oxygen   Oxygen  Continuous    Liters  3      Recumbant Bike   Level  1    RPM  50    Watts  15    Minutes  15    METs  1.2      NuStep   Level  1    SPM  80    Minutes  15    METs  1.2      REL-XR   Level  1    Speed  50    Minutes  15    METs  1.2      Prescription Details   Frequency (times per week)  3    Duration  Progress to 30 minutes of continuous aerobic without signs/symptoms of physical distress      Intensity   THRR 40-80% of Max Heartrate  100-129    Ratings of Perceived Exertion  11-13    Perceived Dyspnea  0-4      Progression   Progression  Continue to progress workloads to maintain intensity without signs/symptoms of physical distress.      Resistance Training   Training Prescription  Yes    Weight  3 lbs    Reps  10-15       Discharge Exercise Prescription (Final Exercise Prescription Changes): Exercise Prescription Changes - 03/14/19 1300       Response to Exercise   Blood Pressure (Admit)  126/62    Blood Pressure (Exercise)  128/62    Blood Pressure (Exit)  100/62    Heart Rate (Admit)  84 bpm    Heart Rate (Exercise)  100 bpm    Heart Rate (Exit)  90 bpm    Oxygen Saturation (Admit)  99 %    Oxygen Saturation (Exercise)  96 %    Oxygen Saturation (Exit)  99 %    Rating of Perceived Exertion (Exercise)  12    Perceived Dyspnea (Exercise)  3      Resistance Training   Training Prescription  Yes    Weight  3 lb    Reps  10-15      Oxygen   Oxygen  Continuous    Liters  4      Treadmill   MPH  1.3    Minutes  15    METs  1.99      T5 Nustep   Level  1    SPM  80    Minutes  15    METs  1.7       Functional Capacity: 6 Minute Walk    Row Name 02/27/19 1030         6 Minute Walk   Phase  Initial     Distance  780 feet     Walk Time  4.25 minutes     # of Rest Breaks  0 test terminated at 4:15 due to desaturation     MPH  2.09     METS  1.24     RPE  15     Perceived Dyspnea   2     VO2 Peak  4.36     Symptoms  Yes (comment)     Comments  SOB     Resting HR  71 bpm     Resting BP  126/64     Resting Oxygen Saturation   99 %     Exercise Oxygen Saturation  during 6 min walk  79 %     Max Ex. HR  93 bpm     Max Ex. BP  144/74     2 Minute Post BP  126/60       Interval HR   1 Minute HR  90     2 Minute HR  82     3 Minute HR  81     4 Minute HR  79     2 Minute Post HR  66     Interval Heart Rate?  Yes       Interval Oxygen   Interval Oxygen?  Yes     Baseline Oxygen Saturation %  99 %     1 Minute Oxygen Saturation %  90 %     1 Minute Liters of Oxygen  3 L pulsed     2 Minute Oxygen Saturation %  82 %     2 Minute Liters of Oxygen  3 L     3 Minute Oxygen Saturation %  81 %     3 Minute Liters of Oxygen  3 L     4 Minute Oxygen Saturation %  79 % stopped at 4:15     4 Minute Liters of Oxygen  3 L     2 Minute Post Oxygen Saturation %  97 %     2 Minute Post Liters of Oxygen  3  L continuous        Psychological, QOL, Others - Outcomes: PHQ 2/9: Depression screen PHQ 2/9 02/27/2019  Decreased Interest 2  Down, Depressed, Hopeless 0  PHQ - 2 Score 2  Altered sleeping 0  Tired, decreased energy 0  Change in appetite 1  Feeling bad or failure about yourself  0  Trouble concentrating 0  Moving slowly or fidgety/restless 0  Suicidal thoughts 0  PHQ-9 Score 3  Difficult doing work/chores Not difficult at all    Quality of Life:   Personal Goals: Goals established at orientation with interventions provided to work toward goal. Personal Goals and Risk Factors  at Admission - 02/27/19 1038      Core Components/Risk Factors/Patient Goals on Admission    Weight Management  Yes;Obesity;Weight Loss    Intervention  Weight Management: Develop a combined nutrition and exercise program designed to reach desired caloric intake, while maintaining appropriate intake of nutrient and fiber, sodium and fats, and appropriate energy expenditure required for the weight goal.;Weight Management: Provide education and appropriate resources to help participant work on and attain dietary goals.;Weight Management/Obesity: Establish reasonable short term and long term weight goals.;Obesity: Provide education and appropriate resources to help participant work on and attain dietary goals.    Admit Weight  244 lb 14.4 oz (111.1 kg)    Goal Weight: Short Term  239 lb (108.4 kg)    Goal Weight: Long Term  230 lb (104.3 kg)    Expected Outcomes  Short Term: Continue to assess and modify interventions until short term weight is achieved;Long Term: Adherence to nutrition and physical activity/exercise program aimed toward attainment of established weight goal;Weight Loss: Understanding of general recommendations for a balanced deficit meal plan, which promotes 1-2 lb weight loss per week and includes a negative energy balance of [phone] kcal/d;Understanding recommendations for meals to include  15-35% energy as protein, 25-35% energy from fat, 35-60% energy from carbohydrates, less than 200mg  of dietary cholesterol, 20-35 gm of total fiber daily;Understanding of distribution of calorie intake throughout the day with the consumption of 4-5 meals/snacks    Improve shortness of breath with ADL's  Yes    Intervention  Provide education, individualized exercise plan and daily activity instruction to help decrease symptoms of SOB with activities of daily living.    Expected Outcomes  Short Term: Improve cardiorespiratory fitness to achieve a reduction of symptoms when performing ADLs;Long Term: Be able to perform more ADLs without symptoms or delay the onset of symptoms    Lipids  Yes    Intervention  Provide education and support for participant on nutrition & aerobic/resistive exercise along with prescribed medications to achieve LDL 70mg , HDL >40mg .    Expected Outcomes  Short Term: Participant states understanding of desired cholesterol values and is compliant with medications prescribed. Participant is following exercise prescription and nutrition guidelines.;Long Term: Cholesterol controlled with medications as prescribed, with individualized exercise RX and with personalized nutrition plan. Value goals: LDL < 70mg , HDL > 40 mg.        Personal Goals Discharge: Goals and Risk Factor Review    Row Name 03/17/19 0858             Core Components/Risk Factors/Patient Goals Review   Personal Goals Review  Weight Management/Obesity;Improve shortness of breath with ADL's;Lipids       Review  Jay Watkins has already started to lose some weight.  He has noticed that his legs are getting stronger, but breathing still needs more.  He is doing well on his medications.  They have not checked pressures at home recently, but they have good in class.  We talked about gettting back into the habit of checking his pressures.       Expected Outcomes  Short: Continue to feel better and start monitoring  pressures.  Long: Continue to work on weight loss.          Exercise Goals and Review: Exercise Goals    Row Name 02/27/19 1037             Exercise Goals   Increase Physical Activity  Yes       Intervention  Provide advice, education, support and counseling about physical activity/exercise needs.;Develop an individualized exercise prescription for aerobic and resistive training based on initial evaluation findings, risk stratification, comorbidities and participant's personal goals.       Expected Outcomes  Short Term: Attend rehab on a regular basis to increase amount of physical activity.;Long Term: Add in home exercise to make exercise part of routine and to increase amount of physical activity.;Long Term: Exercising regularly at least 3-5 days a week.       Increase Strength and Stamina  Yes       Intervention  Provide advice, education, support and counseling about physical activity/exercise needs.;Develop an individualized exercise prescription for aerobic and resistive training based on initial evaluation findings, risk stratification, comorbidities and participant's personal goals.       Expected Outcomes  Short Term: Increase workloads from initial exercise prescription for resistance, speed, and METs.;Short Term: Perform resistance training exercises routinely during rehab and add in resistance training at home;Long Term: Improve cardiorespiratory fitness, muscular endurance and strength as measured by increased METs and functional capacity (6MWT)       Able to understand and use rate of perceived exertion (RPE) scale  Yes       Intervention  Provide education and explanation on how to use RPE scale       Expected Outcomes  Short Term: Able to use RPE daily in rehab to express subjective intensity level;Long Term:  Able to use RPE to guide intensity level when exercising independently       Able to understand and use Dyspnea scale  Yes       Intervention  Provide education and  explanation on how to use Dyspnea scale       Expected Outcomes  Short Term: Able to use Dyspnea scale daily in rehab to express subjective sense of shortness of breath during exertion;Long Term: Able to use Dyspnea scale to guide intensity level when exercising independently       Knowledge and understanding of Target Heart Rate Range (THRR)  Yes       Intervention  Provide education and explanation of THRR including how the numbers were predicted and where they are located for reference       Expected Outcomes  Short Term: Able to state/look up THRR;Short Term: Able to use daily as guideline for intensity in rehab;Long Term: Able to use THRR to govern intensity when exercising independently       Able to check pulse independently  Yes       Intervention  Provide education and demonstration on how to check pulse in carotid and radial arteries.;Review the importance of being able to check your own pulse for safety during independent exercise       Expected Outcomes  Short Term: Able to explain why pulse checking is important during independent exercise;Long Term: Able to check pulse independently and accurately       Understanding of Exercise Prescription  Yes       Intervention  Provide education, explanation, and written materials on patient's individual exercise prescription       Expected Outcomes  Short Term: Able to explain program exercise prescription;Long Term: Able to explain home exercise prescription to exercise independently          Exercise Goals Re-Evaluation: Exercise Goals Re-Evaluation    Row Name 03/06/19 0842 03/14/19 1307 03/17/19 0852         Exercise Goal Re-Evaluation   Exercise Goals Review  Increase Physical  Activity;Increase Strength and Stamina;Able to understand and use rate of perceived exertion (RPE) scale;Knowledge and understanding of Target Heart Rate Range (THRR);Understanding of Exercise Prescription  Increase Physical Activity;Increase Strength and  Stamina;Able to understand and use rate of perceived exertion (RPE) scale;Knowledge and understanding of Target Heart Rate Range (THRR);Understanding of Exercise Prescription  Increase Physical Activity;Increase Strength and Stamina;Understanding of Exercise Prescription     Comments  Reviewed RPE scale, THR and program prescription with pt today.  Pt voiced understanding and was given a copy of goals to take home.  Today was second session for this patient.  He will need to attend consistently to progress  Jay Watkins is off to a good start with rehab. He is feeling pretty good with exercise and is tired when he leaves class.  He has already noticed that his legs are starting to get stronger. Reviewed home exercise with pt today.  Pt plans to walk at home for exercise.  Reviewed THR, pulse, RPE, sign and symptoms, NTG use, and when to call 911 or MD.  Also discussed weather considerations and indoor options.  Pt voiced understanding.     Expected Outcomes  Short: Use RPE daily to regulate intensity. Long: Follow program prescription in THR.  Short - attend 2-3 times per week Long - increase stamina  Short: Start walking more at home.  Long: Continue to improve stamina.        Nutrition & Weight - Outcomes: Pre Biometrics - 02/27/19 1038      Pre Biometrics   Height  5' 9.9" (1.775 m)    Weight  244 lb 14.4 oz (111.1 kg)    BMI (Calculated)  35.26    Single Leg Stand  5.03 seconds        Nutrition: Nutrition Therapy & Goals - 02/27/19 1035      Nutrition Therapy   Diet  Low Na, HH diet    Protein (specify units)  90g    Fiber  30 grams    Whole Grain Foods  3 servings    Saturated Fats  12 max. grams    Fruits and Vegetables  5 servings/day    Sodium  1.5 grams      Personal Nutrition Goals   Nutrition Goal  ST: get results from scopy procedures and then we can discuss healthy snacks LT: get function and mobility back    Comments  Pt wife reports pt will have scrambled eggs with whole grain  english muffin for breakfast, L: lean meat (usually chicken, sometimes pork and occassionally hamburgers), salads with low-fat dressing and roasted vegetables and potatoes with olive oil and herbs, D: honey nut cheerios with skim milk. Pt will drink coffee and water during the day. Pt is mechanically eating as has very low appetite. Pt will have mcdonalds 1x/week. Pt reports not being able to eat certain fruits or yogurt - having scopy to see what is going one. pt reports loving beans.      Intervention Plan   Intervention  Prescribe, educate and counsel regarding individualized specific dietary modifications aiming towards targeted core components such as weight, hypertension, lipid management, diabetes, heart failure and other comorbidities.;Nutrition handout(s) given to patient.    Expected Outcomes  Short Term Goal: Understand basic principles of dietary content, such as calories, fat, sodium, cholesterol and nutrients.;Short Term Goal: A plan has been developed with personal nutrition goals set during dietitian appointment.;Long Term Goal: Adherence to prescribed nutrition plan.       Nutrition Discharge:  Nutrition Assessments - 02/27/19 1033      MEDFICTS Scores   Pre Score  23       Education Questionnaire Score: Knowledge Questionnaire Score - 02/27/19 1048      Knowledge Questionnaire Score   Pre Score  15/18 Education Focus: Oxygen Safety, MDIs       Goals reviewed with patient; copy given to patient.

## 2019-10-08 ENCOUNTER — Other Ambulatory Visit: Payer: Self-pay | Admitting: Specialist

## 2019-10-08 DIAGNOSIS — R0609 Other forms of dyspnea: Secondary | ICD-10-CM

## 2019-10-08 DIAGNOSIS — J849 Interstitial pulmonary disease, unspecified: Secondary | ICD-10-CM

## 2020-02-20 ENCOUNTER — Ambulatory Visit: Payer: Medicare HMO

## 2020-02-25 ENCOUNTER — Ambulatory Visit: Payer: Medicare HMO

## 2020-03-09 ENCOUNTER — Other Ambulatory Visit: Payer: Self-pay | Admitting: Specialist

## 2020-03-09 DIAGNOSIS — R0609 Other forms of dyspnea: Secondary | ICD-10-CM

## 2020-03-09 DIAGNOSIS — J849 Interstitial pulmonary disease, unspecified: Secondary | ICD-10-CM

## 2020-03-16 ENCOUNTER — Ambulatory Visit
Admission: RE | Admit: 2020-03-16 | Discharge: 2020-03-16 | Disposition: A | Discharge status: Home / Self Care | Payer: Medicare HMO | Source: Ambulatory Visit | Attending: Specialist | Admitting: Specialist

## 2020-03-16 ENCOUNTER — Other Ambulatory Visit: Payer: Self-pay

## 2020-03-16 DIAGNOSIS — R0600 Dyspnea, unspecified: Secondary | ICD-10-CM | POA: Insufficient documentation

## 2020-03-16 DIAGNOSIS — J849 Interstitial pulmonary disease, unspecified: Secondary | ICD-10-CM | POA: Diagnosis present

## 2020-03-16 DIAGNOSIS — R0609 Other forms of dyspnea: Secondary | ICD-10-CM

## 2020-07-04 DIAGNOSIS — J449 Chronic obstructive pulmonary disease, unspecified: Secondary | ICD-10-CM | POA: Diagnosis not present

## 2020-08-04 DIAGNOSIS — J449 Chronic obstructive pulmonary disease, unspecified: Secondary | ICD-10-CM | POA: Diagnosis not present

## 2020-08-13 DIAGNOSIS — B351 Tinea unguium: Secondary | ICD-10-CM | POA: Diagnosis not present

## 2020-08-13 DIAGNOSIS — I739 Peripheral vascular disease, unspecified: Secondary | ICD-10-CM | POA: Diagnosis not present

## 2020-08-17 DIAGNOSIS — R519 Headache, unspecified: Secondary | ICD-10-CM | POA: Diagnosis not present

## 2020-08-17 DIAGNOSIS — G8929 Other chronic pain: Secondary | ICD-10-CM | POA: Diagnosis not present

## 2020-08-17 DIAGNOSIS — Z8673 Personal history of transient ischemic attack (TIA), and cerebral infarction without residual deficits: Secondary | ICD-10-CM | POA: Diagnosis not present

## 2020-08-17 DIAGNOSIS — M545 Low back pain, unspecified: Secondary | ICD-10-CM | POA: Diagnosis not present

## 2020-08-17 DIAGNOSIS — H9313 Tinnitus, bilateral: Secondary | ICD-10-CM | POA: Diagnosis not present

## 2020-08-17 DIAGNOSIS — H43393 Other vitreous opacities, bilateral: Secondary | ICD-10-CM | POA: Diagnosis not present

## 2020-08-17 DIAGNOSIS — R251 Tremor, unspecified: Secondary | ICD-10-CM | POA: Diagnosis not present

## 2020-08-17 DIAGNOSIS — R42 Dizziness and giddiness: Secondary | ICD-10-CM | POA: Diagnosis not present

## 2020-08-24 DIAGNOSIS — R350 Frequency of micturition: Secondary | ICD-10-CM | POA: Diagnosis not present

## 2020-08-24 DIAGNOSIS — R42 Dizziness and giddiness: Secondary | ICD-10-CM | POA: Diagnosis not present

## 2020-08-24 DIAGNOSIS — J449 Chronic obstructive pulmonary disease, unspecified: Secondary | ICD-10-CM | POA: Diagnosis not present

## 2020-08-24 DIAGNOSIS — Z8673 Personal history of transient ischemic attack (TIA), and cerebral infarction without residual deficits: Secondary | ICD-10-CM | POA: Diagnosis not present

## 2020-08-24 DIAGNOSIS — N401 Enlarged prostate with lower urinary tract symptoms: Secondary | ICD-10-CM | POA: Diagnosis not present

## 2020-08-24 DIAGNOSIS — Z6837 Body mass index (BMI) 37.0-37.9, adult: Secondary | ICD-10-CM | POA: Diagnosis not present

## 2020-08-24 DIAGNOSIS — M341 CR(E)ST syndrome: Secondary | ICD-10-CM | POA: Diagnosis not present

## 2020-09-01 DIAGNOSIS — J449 Chronic obstructive pulmonary disease, unspecified: Secondary | ICD-10-CM | POA: Diagnosis not present

## 2020-09-01 DIAGNOSIS — M341 CR(E)ST syndrome: Secondary | ICD-10-CM | POA: Diagnosis not present

## 2020-09-01 DIAGNOSIS — Z79899 Other long term (current) drug therapy: Secondary | ICD-10-CM | POA: Diagnosis not present

## 2020-09-02 DIAGNOSIS — I517 Cardiomegaly: Secondary | ICD-10-CM | POA: Diagnosis not present

## 2020-09-02 DIAGNOSIS — R0602 Shortness of breath: Secondary | ICD-10-CM | POA: Diagnosis not present

## 2020-09-21 DIAGNOSIS — R600 Localized edema: Secondary | ICD-10-CM | POA: Diagnosis not present

## 2020-09-21 DIAGNOSIS — R809 Proteinuria, unspecified: Secondary | ICD-10-CM | POA: Diagnosis not present

## 2020-09-23 DIAGNOSIS — I719 Aortic aneurysm of unspecified site, without rupture: Secondary | ICD-10-CM | POA: Diagnosis not present

## 2020-09-23 DIAGNOSIS — I495 Sick sinus syndrome: Secondary | ICD-10-CM | POA: Diagnosis not present

## 2020-09-23 DIAGNOSIS — Z950 Presence of cardiac pacemaker: Secondary | ICD-10-CM | POA: Diagnosis not present

## 2020-09-23 DIAGNOSIS — M341 CR(E)ST syndrome: Secondary | ICD-10-CM | POA: Diagnosis not present

## 2020-09-23 DIAGNOSIS — J449 Chronic obstructive pulmonary disease, unspecified: Secondary | ICD-10-CM | POA: Diagnosis not present

## 2020-10-02 DIAGNOSIS — J449 Chronic obstructive pulmonary disease, unspecified: Secondary | ICD-10-CM | POA: Diagnosis not present

## 2020-10-05 DIAGNOSIS — K2270 Barrett's esophagus without dysplasia: Secondary | ICD-10-CM | POA: Diagnosis not present

## 2020-10-05 DIAGNOSIS — R1319 Other dysphagia: Secondary | ICD-10-CM | POA: Diagnosis not present

## 2020-10-12 DIAGNOSIS — H2513 Age-related nuclear cataract, bilateral: Secondary | ICD-10-CM | POA: Diagnosis not present

## 2020-10-12 DIAGNOSIS — H52223 Regular astigmatism, bilateral: Secondary | ICD-10-CM | POA: Diagnosis not present

## 2020-10-12 DIAGNOSIS — H524 Presbyopia: Secondary | ICD-10-CM | POA: Diagnosis not present

## 2020-10-12 DIAGNOSIS — Z135 Encounter for screening for eye and ear disorders: Secondary | ICD-10-CM | POA: Diagnosis not present

## 2020-10-12 DIAGNOSIS — H5203 Hypermetropia, bilateral: Secondary | ICD-10-CM | POA: Diagnosis not present

## 2020-10-12 DIAGNOSIS — H04123 Dry eye syndrome of bilateral lacrimal glands: Secondary | ICD-10-CM | POA: Diagnosis not present

## 2020-10-18 DIAGNOSIS — R42 Dizziness and giddiness: Secondary | ICD-10-CM | POA: Diagnosis not present

## 2020-10-18 DIAGNOSIS — R519 Headache, unspecified: Secondary | ICD-10-CM | POA: Diagnosis not present

## 2020-10-18 DIAGNOSIS — M545 Low back pain, unspecified: Secondary | ICD-10-CM | POA: Diagnosis not present

## 2020-10-18 DIAGNOSIS — R251 Tremor, unspecified: Secondary | ICD-10-CM | POA: Diagnosis not present

## 2020-10-18 DIAGNOSIS — H43393 Other vitreous opacities, bilateral: Secondary | ICD-10-CM | POA: Diagnosis not present

## 2020-10-18 DIAGNOSIS — H9313 Tinnitus, bilateral: Secondary | ICD-10-CM | POA: Diagnosis not present

## 2020-10-18 DIAGNOSIS — Z8673 Personal history of transient ischemic attack (TIA), and cerebral infarction without residual deficits: Secondary | ICD-10-CM | POA: Diagnosis not present

## 2020-10-20 ENCOUNTER — Other Ambulatory Visit (HOSPITAL_COMMUNITY): Payer: Self-pay | Admitting: Neurology

## 2020-10-20 ENCOUNTER — Other Ambulatory Visit: Payer: Self-pay | Admitting: Neurology

## 2020-10-20 DIAGNOSIS — Z8673 Personal history of transient ischemic attack (TIA), and cerebral infarction without residual deficits: Secondary | ICD-10-CM

## 2020-10-27 ENCOUNTER — Ambulatory Visit
Admission: RE | Admit: 2020-10-27 | Discharge: 2020-10-27 | Disposition: A | Discharge status: Home / Self Care | Payer: PPO | Source: Ambulatory Visit | Attending: Neurology | Admitting: Neurology

## 2020-10-27 ENCOUNTER — Other Ambulatory Visit: Payer: Self-pay

## 2020-10-27 DIAGNOSIS — R519 Headache, unspecified: Secondary | ICD-10-CM | POA: Insufficient documentation

## 2020-10-27 DIAGNOSIS — G459 Transient cerebral ischemic attack, unspecified: Secondary | ICD-10-CM | POA: Diagnosis not present

## 2020-10-27 DIAGNOSIS — Z8673 Personal history of transient ischemic attack (TIA), and cerebral infarction without residual deficits: Secondary | ICD-10-CM | POA: Insufficient documentation

## 2020-10-27 DIAGNOSIS — R42 Dizziness and giddiness: Secondary | ICD-10-CM | POA: Diagnosis not present

## 2020-10-27 DIAGNOSIS — I6782 Cerebral ischemia: Secondary | ICD-10-CM | POA: Diagnosis not present

## 2020-11-01 DIAGNOSIS — J449 Chronic obstructive pulmonary disease, unspecified: Secondary | ICD-10-CM | POA: Diagnosis not present

## 2020-11-09 DIAGNOSIS — Z01818 Encounter for other preprocedural examination: Secondary | ICD-10-CM | POA: Diagnosis not present

## 2020-11-23 DIAGNOSIS — Z01818 Encounter for other preprocedural examination: Secondary | ICD-10-CM | POA: Diagnosis not present

## 2020-11-25 DIAGNOSIS — Z01818 Encounter for other preprocedural examination: Secondary | ICD-10-CM | POA: Diagnosis not present

## 2020-11-25 DIAGNOSIS — J449 Chronic obstructive pulmonary disease, unspecified: Secondary | ICD-10-CM | POA: Diagnosis not present

## 2020-11-25 DIAGNOSIS — Z9981 Dependence on supplemental oxygen: Secondary | ICD-10-CM | POA: Diagnosis not present

## 2020-11-25 DIAGNOSIS — R0600 Dyspnea, unspecified: Secondary | ICD-10-CM | POA: Diagnosis not present

## 2020-11-25 DIAGNOSIS — M349 Systemic sclerosis, unspecified: Secondary | ICD-10-CM | POA: Diagnosis not present

## 2020-11-26 DIAGNOSIS — R569 Unspecified convulsions: Secondary | ICD-10-CM | POA: Diagnosis not present

## 2020-11-27 DIAGNOSIS — G4733 Obstructive sleep apnea (adult) (pediatric): Secondary | ICD-10-CM | POA: Diagnosis not present

## 2020-12-02 DIAGNOSIS — J449 Chronic obstructive pulmonary disease, unspecified: Secondary | ICD-10-CM | POA: Diagnosis not present

## 2020-12-07 ENCOUNTER — Ambulatory Visit: Admission: RE | Admit: 2020-12-07 | Payer: PPO | Source: Home / Self Care

## 2020-12-07 ENCOUNTER — Ambulatory Visit: Payer: Self-pay | Admitting: General Surgery

## 2020-12-07 ENCOUNTER — Encounter: Admission: RE | Payer: Self-pay | Source: Home / Self Care

## 2020-12-07 DIAGNOSIS — T8189XA Other complications of procedures, not elsewhere classified, initial encounter: Secondary | ICD-10-CM | POA: Diagnosis not present

## 2020-12-07 SURGERY — ESOPHAGOGASTRODUODENOSCOPY (EGD) WITH PROPOFOL
Anesthesia: General

## 2020-12-08 ENCOUNTER — Ambulatory Visit: Payer: Self-pay | Admitting: General Surgery

## 2020-12-08 NOTE — H&P (View-Only) (Signed)
HISTORY OF PRESENT ILLNESS:    Jay Watkins is a 80 y.o.male patient who comes for evaluation of wound granuloma.  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  Patient was doing well until last week that he started with drainage through the wound.  Patient denies any pain.  No pain radiation.  No alleviating or aggravating factor.  Patient has been taking care of the drainage with soap and water.  There has been no spreading of the drainage.  No significant skin discoloration.  Patient denies any chest pain.  Patient denies any shortness of breath.  Patient denies any fever or chills.      PAST MEDICAL HISTORY:  Past Medical History:  Diagnosis Date  . Arthritis    left ankle   . Asbestos exposure   . B12 deficiency   . BPH with obstruction/lower urinary tract symptoms   . Carpal tunnel syndrome   . COPD (chronic obstructive pulmonary disease) (CMS-HCC)   . CVA (cerebral vascular accident) (CMS-HCC)   . Difficulty walking   . Diverticulosis   . Dysphasia   . Dyspnea   . Edema   . Elevated PSA   . GERD (gastroesophageal reflux disease)   . Gouty arthropathy   . Hyperlipidemia   . Hypotension   . Memory loss   . Neuropathy    idopathic peripheral  . Onychomycosis   . Orthostatic hypotension   . Pacemaker   . Patent foramen ovale   . Prediabetes   . Proteinuria   . Renal calculus   . Scleroderma (CMS-HCC)   . Serrated adenoma of colon, unspecified 01/16/2017  . Sleep apnea   . Spondylosis of cervical spine   . Stasis dermatitis   . Tubular adenoma of colon, unspecified 01/16/2017  . Ulcer of lower limb (CMS-HCC)    chronic  . Urinary frequency   . Varicose veins of leg with edema         PAST SURGICAL HISTORY:   Past Surgical History:  Procedure Laterality Date  . COLONOSCOPY  12/29/2016   Procedure aborted/Reprep and reschedule  . COLONOSCOPY  01/16/2017   Tubular adenoma of colon,/Serrated adenoma/Repeat 60yrs/MUS  . COLONOSCOPY  03/03/2019    Tubular adenoma of the colon/Repeat 49yrs/MUS  . EGD  12/29/2016   Chronic inflammation/Repeat 72yrs/MUS  . EGD  03/03/2019   Intestinal metaplasia/No Repeat/MUS  . Hemorrhoid surgery    . Left ankle fusion surgery  05/2013  . neck fusion     cervical spine  . Pacemaker placement  1996         MEDICATIONS:  Outpatient Encounter Medications as of 12/07/2020  Medication Sig Dispense Refill  . albuterol (PROVENTIL) 2.5 mg /3 mL (0.083 %) nebulizer solution Take 3 mLs (2.5 mg total) by nebulization every 6 (six) hours as needed for Wheezing (Patient taking differently: Take 2.5 mg by nebulization every 6 (six) hours as needed for Wheezing Patient using  BID) 75 mL 12  . albuterol 90 mcg/actuation inhaler Inhale 2 inhalations into the lungs every 6 (six) hours as needed for Wheezing 1 g 4  . aspirin 81 MG EC tablet Take 81 mg by mouth every Monday, Wednesday, and Friday      . atorvastatin (LIPITOR) 80 MG tablet TAKE ONE TABLET BY MOUTH ONE TIME DAILY 90 tablet 0  . azaTHIOprine (IMURAN) 50 mg tablet Take 1 tablet (50 mg total) by mouth once daily 90 tablet 1  . clopidogreL (PLAVIX) 75 mg tablet TAKE  1 TABLET BY MOUTH EVERY DAY 90 tablet 1  . cyanocobalamin (VITAMIN B12) 500 MCG tablet Take 500 mcg by mouth once daily    . finasteride (PROSCAR) 5 mg tablet TAKE 1 TABLET BY MOUTH EVERY DAY 90 tablet 1  . midodrine (PROAMATINE) 10 MG tablet Take 1 tablet (10 mg total) by mouth 2 (two) times daily 180 tablet 1  . nitroGLYcerin (NITROBID) 2 % ointment Place 0.5 inches onto the skin every 12 (twelve) hours 30 g 5  . OXYGEN-AIR DELIVERY SYSTEMS MISC Use 3 L once daily    . pantoprazole (PROTONIX) 40 MG DR tablet TAKE ONE TABLET BY MOUTH TWICE A DAY BEFORE A MEAL 60 tablet 1  . predniSONE (DELTASONE) 10 MG tablet TAKE ONE TABLET BY MOUTH ONE TIME DAILY 90 tablet 0  . umeclidinium-vilanteroL (ANORO ELLIPTA) 62.5-25 mcg/actuation inhaler Inhale 1 inhalation into the lungs once daily 1 each 6  .  doxycycline (MONODOX) 100 MG capsule Take 1 capsule (100 mg total) by mouth 2 (two) times daily for 7 days 14 capsule 0   No facility-administered encounter medications on file as of 12/07/2020.     ALLERGIES:   Mycophenolate mofetil, Rofecoxib, and Ciprofloxacin   SOCIAL HISTORY:  Social History   Socioeconomic History  . Marital status: Married  Occupational History  . Occupation: retired  Tobacco Use  . Smoking status: Never Smoker  . Smokeless tobacco: Never Used  Vaping Use  . Vaping Use: Never used  Substance and Sexual Activity  . Alcohol use: No  . Drug use: No  Social History Narrative   Marital Status- Married   Lives with wife    Employment- Retired   Exercise hx- Occasional   Religious Affiliation- [employer]    FAMILY HISTORY:  Family History  Problem Relation Age of Onset  . Pancreatic cancer Mother   . Lung cancer Brother   . Parkinsonism Brother   . Cancer Brother   . Alzheimer's disease Sister   . Alzheimer's disease Sister   . ALS Sister      GENERAL REVIEW OF SYSTEMS:   General ROS: negative for - chills, fatigue, fever, weight gain or weight loss Allergy and Immunology ROS: negative for - hives  Hematological and Lymphatic ROS: negative for - bleeding problems or bruising, negative for palpable nodes Endocrine ROS: negative for - heat or cold intolerance, hair changes Respiratory ROS: negative for - cough, shortness of breath or wheezing Cardiovascular ROS: no chest pain or palpitations GI ROS: negative for nausea, vomiting, abdominal pain, diarrhea, constipation Musculoskeletal ROS: negative for - joint swelling or muscle pain Neurological ROS: negative for - confusion, syncope Dermatological ROS: negative for pruritus and rash  PHYSICAL EXAM:  Vitals:   12/07/20 1445  BP: 126/78  Pulse: 81  .  Ht:175.3 cm (5\' 9" ) Wt:(!) 116.1 kg (256 lb) INO:MVEH surface area is 2.38 meters squared. Body mass index is 37.8 kg/m.Marland Kitchen   GENERAL: Alert,  active, oriented x3  HEENT: Pupils equal reactive to light. Extraocular movements are intact. Sclera clear. Palpebral conjunctiva normal red color.Pharynx clear.  NECK: Supple with no palpable mass and no adenopathy.  LUNGS: Sound clear with no rales rhonchi or wheezes.  HEART: Regular rhythm S1 and S2 without murmur.  CHEST: Left chest scar over the palpable pacemaker with granulation tissue with chronic sinus drainage.  No cellulitis.  No deep fluid collection.  ABDOMEN: Soft and depressible, nontender with no palpable mass, no hepatomegaly.   EXTREMITIES: Well-developed well-nourished symmetrical with no  dependent edema.  NEUROLOGICAL: Awake alert oriented, facial expression symmetrical, moving all extremities.      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  Due to the fact that this patient is having chronic sinus drainage on top of the pacemaker with a very thin skin I think that is reasonable to proceed with aggressive treatment with excision of the granuloma tissue with a wider flap to cover the pacemaker.  Last decision was done in the office with a very localized excision of the granuloma and findings of very thin skin on top of the pacemaker was identified.  I do think that it is possible to do a very localized excision at this time since again the tissue under and around the granuloma is very minimal and basically in contact with the pacemaker.  This will expose the pacemaker and increase the risk of further complication and infections.  I discussed the case with the cardiologist who agreed to proceed with wide local excision of the granuloma, scar and trying to cover the pacemaker with new skin flap.  This will be the last chance to save this pacemaker.  The alternative of changing the pacemaker is extremely complicated as per cardiologist.  Patient understand that he is at her high risk for surgery  due to his medical comorbidities.  As per cardiologist it is okay to hold Plavix and aspirin for 5 days.  I will proceed to perform these procedures in the operation room to have better equipment, lightening and help and a more controlled sterile field.  I had the discussion with the patient regarding these plan and he agreed to proceed.      PLAN:  1. Excision of stitch granuloma with transfer of tissue 2. Take antibiotics as prescribed 3. Hold Aspirin and Plavix 5 days before the surgery 4. Cardiology clearance 5. Contact us if you have any concern.   Patient and his daughter verbalized understanding, all questions were answered, and were agreeable with the plan outlined above.   I spent a total of 50 minutes in both face-to-face and non-face-to-face activities for this visit on the date of this encounter.  Herbert Pun, MD  Electronically signed by Herbert Pun, MD

## 2020-12-08 NOTE — H&P (Signed)
HISTORY OF PRESENT ILLNESS:    Jay Watkins is a 80 y.o.male patient who comes for evaluation of wound granuloma.  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  Patient was doing well until last week that he started with drainage through the wound.  Patient denies any pain.  No pain radiation.  No alleviating or aggravating factor.  Patient has been taking care of the drainage with soap and water.  There has been no spreading of the drainage.  No significant skin discoloration.  Patient denies any chest pain.  Patient denies any shortness of breath.  Patient denies any fever or chills.      PAST MEDICAL HISTORY:  Past Medical History:  Diagnosis Date  . Arthritis    left ankle   . Asbestos exposure   . B12 deficiency   . BPH with obstruction/lower urinary tract symptoms   . Carpal tunnel syndrome   . COPD (chronic obstructive pulmonary disease) (CMS-HCC)   . CVA (cerebral vascular accident) (CMS-HCC)   . Difficulty walking   . Diverticulosis   . Dysphasia   . Dyspnea   . Edema   . Elevated PSA   . GERD (gastroesophageal reflux disease)   . Gouty arthropathy   . Hyperlipidemia   . Hypotension   . Memory loss   . Neuropathy    idopathic peripheral  . Onychomycosis   . Orthostatic hypotension   . Pacemaker   . Patent foramen ovale   . Prediabetes   . Proteinuria   . Renal calculus   . Scleroderma (CMS-HCC)   . Serrated adenoma of colon, unspecified 01/16/2017  . Sleep apnea   . Spondylosis of cervical spine   . Stasis dermatitis   . Tubular adenoma of colon, unspecified 01/16/2017  . Ulcer of lower limb (CMS-HCC)    chronic  . Urinary frequency   . Varicose veins of leg with edema         PAST SURGICAL HISTORY:   Past Surgical History:  Procedure Laterality Date  . COLONOSCOPY  12/29/2016   Procedure aborted/Reprep and reschedule  . COLONOSCOPY  01/16/2017   Tubular adenoma of colon,/Serrated adenoma/Repeat 63yrs/MUS  . COLONOSCOPY  03/03/2019    Tubular adenoma of the colon/Repeat 53yrs/MUS  . EGD  12/29/2016   Chronic inflammation/Repeat 65yrs/MUS  . EGD  03/03/2019   Intestinal metaplasia/No Repeat/MUS  . Hemorrhoid surgery    . Left ankle fusion surgery  05/2013  . neck fusion     cervical spine  . Pacemaker placement  1996         MEDICATIONS:  Outpatient Encounter Medications as of 12/07/2020  Medication Sig Dispense Refill  . albuterol (PROVENTIL) 2.5 mg /3 mL (0.083 %) nebulizer solution Take 3 mLs (2.5 mg total) by nebulization every 6 (six) hours as needed for Wheezing (Patient taking differently: Take 2.5 mg by nebulization every 6 (six) hours as needed for Wheezing Patient using  BID) 75 mL 12  . albuterol 90 mcg/actuation inhaler Inhale 2 inhalations into the lungs every 6 (six) hours as needed for Wheezing 1 g 4  . aspirin 81 MG EC tablet Take 81 mg by mouth every Monday, Wednesday, and Friday      . atorvastatin (LIPITOR) 80 MG tablet TAKE ONE TABLET BY MOUTH ONE TIME DAILY 90 tablet 0  . azaTHIOprine (IMURAN) 50 mg tablet Take 1 tablet (50 mg total) by mouth once daily 90 tablet 1  . clopidogreL (PLAVIX) 75 mg tablet TAKE  1 TABLET BY MOUTH EVERY DAY 90 tablet 1  . cyanocobalamin (VITAMIN B12) 500 MCG tablet Take 500 mcg by mouth once daily    . finasteride (PROSCAR) 5 mg tablet TAKE 1 TABLET BY MOUTH EVERY DAY 90 tablet 1  . midodrine (PROAMATINE) 10 MG tablet Take 1 tablet (10 mg total) by mouth 2 (two) times daily 180 tablet 1  . nitroGLYcerin (NITROBID) 2 % ointment Place 0.5 inches onto the skin every 12 (twelve) hours 30 g 5  . OXYGEN-AIR DELIVERY SYSTEMS MISC Use 3 L once daily    . pantoprazole (PROTONIX) 40 MG DR tablet TAKE ONE TABLET BY MOUTH TWICE A DAY BEFORE A MEAL 60 tablet 1  . predniSONE (DELTASONE) 10 MG tablet TAKE ONE TABLET BY MOUTH ONE TIME DAILY 90 tablet 0  . umeclidinium-vilanteroL (ANORO ELLIPTA) 62.5-25 mcg/actuation inhaler Inhale 1 inhalation into the lungs once daily 1 each 6  .  doxycycline (MONODOX) 100 MG capsule Take 1 capsule (100 mg total) by mouth 2 (two) times daily for 7 days 14 capsule 0   No facility-administered encounter medications on file as of 12/07/2020.     ALLERGIES:   Mycophenolate mofetil, Rofecoxib, and Ciprofloxacin   SOCIAL HISTORY:  Social History   Socioeconomic History  . Marital status: Married  Occupational History  . Occupation: retired  Tobacco Use  . Smoking status: Never Smoker  . Smokeless tobacco: Never Used  Vaping Use  . Vaping Use: Never used  Substance and Sexual Activity  . Alcohol use: No  . Drug use: No  Social History Narrative   Marital Status- Married   Lives with wife    Employment- Retired   Exercise hx- Occasional   Religious Affiliation- [employer]    FAMILY HISTORY:  Family History  Problem Relation Age of Onset  . Pancreatic cancer Mother   . Lung cancer Brother   . Parkinsonism Brother   . Cancer Brother   . Alzheimer's disease Sister   . Alzheimer's disease Sister   . ALS Sister      GENERAL REVIEW OF SYSTEMS:   General ROS: negative for - chills, fatigue, fever, weight gain or weight loss Allergy and Immunology ROS: negative for - hives  Hematological and Lymphatic ROS: negative for - bleeding problems or bruising, negative for palpable nodes Endocrine ROS: negative for - heat or cold intolerance, hair changes Respiratory ROS: negative for - cough, shortness of breath or wheezing Cardiovascular ROS: no chest pain or palpitations GI ROS: negative for nausea, vomiting, abdominal pain, diarrhea, constipation Musculoskeletal ROS: negative for - joint swelling or muscle pain Neurological ROS: negative for - confusion, syncope Dermatological ROS: negative for pruritus and rash  PHYSICAL EXAM:  Vitals:   12/07/20 1445  BP: 126/78  Pulse: 81  .  Ht:175.3 cm (5\' 9" ) Wt:(!) 116.1 kg (256 lb) IWL:NLGX surface area is 2.38 meters squared. Body mass index is 37.8 kg/m.Marland Kitchen   GENERAL: Alert,  active, oriented x3  HEENT: Pupils equal reactive to light. Extraocular movements are intact. Sclera clear. Palpebral conjunctiva normal red color.Pharynx clear.  NECK: Supple with no palpable mass and no adenopathy.  LUNGS: Sound clear with no rales rhonchi or wheezes.  HEART: Regular rhythm S1 and S2 without murmur.  CHEST: Left chest scar over the palpable pacemaker with granulation tissue with chronic sinus drainage.  No cellulitis.  No deep fluid collection.  ABDOMEN: Soft and depressible, nontender with no palpable mass, no hepatomegaly.   EXTREMITIES: Well-developed well-nourished symmetrical with no  dependent edema.  NEUROLOGICAL: Awake alert oriented, facial expression symmetrical, moving all extremities.      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  Due to the fact that this patient is having chronic sinus drainage on top of the pacemaker with a very thin skin I think that is reasonable to proceed with aggressive treatment with excision of the granuloma tissue with a wider flap to cover the pacemaker.  Last decision was done in the office with a very localized excision of the granuloma and findings of very thin skin on top of the pacemaker was identified.  I do think that it is possible to do a very localized excision at this time since again the tissue under and around the granuloma is very minimal and basically in contact with the pacemaker.  This will expose the pacemaker and increase the risk of further complication and infections.  I discussed the case with the cardiologist who agreed to proceed with wide local excision of the granuloma, scar and trying to cover the pacemaker with new skin flap.  This will be the last chance to save this pacemaker.  The alternative of changing the pacemaker is extremely complicated as per cardiologist.  Patient understand that he is at her high risk for surgery  due to his medical comorbidities.  As per cardiologist it is okay to hold Plavix and aspirin for 5 days.  I will proceed to perform these procedures in the operation room to have better equipment, lightening and help and a more controlled sterile field.  I had the discussion with the patient regarding these plan and he agreed to proceed.      PLAN:  1. Excision of stitch granuloma with transfer of tissue 2. Take antibiotics as prescribed 3. Hold Aspirin and Plavix 5 days before the surgery 4. Cardiology clearance 5. Contact us if you have any concern.   Patient and his daughter verbalized understanding, all questions were answered, and were agreeable with the plan outlined above.   I spent a total of 50 minutes in both face-to-face and non-face-to-face activities for this visit on the date of this encounter.  Herbert Pun, MD  Electronically signed by Herbert Pun, MD

## 2020-12-10 ENCOUNTER — Encounter: Payer: Self-pay | Admitting: General Surgery

## 2020-12-10 ENCOUNTER — Other Ambulatory Visit
Admission: RE | Admit: 2020-12-10 | Discharge: 2020-12-10 | Disposition: A | Discharge status: Home / Self Care | Payer: PPO | Source: Ambulatory Visit | Attending: General Surgery | Admitting: General Surgery

## 2020-12-10 NOTE — Patient Instructions (Addendum)
Your procedure is scheduled on: 12/13/20 Report to the Registration Desk on the 1st floor of the [facility]. To find out your arrival time, please call [phone] between 1PM - 3PM on: 12/10/20  REMEMBER: Instructions that are not followed completely may result in serious medical risk, up to and including death; or upon the discretion of your surgeon and anesthesiologist your surgery may need to be rescheduled.  Do not eat food or drink any fluids after midnight the night before surgery.  No gum chewing, lozengers or hard candies.  TAKE THESE MEDICATIONS THE MORNING OF SURGERY WITH A SIP OF WATER: - azaTHIOprine (IMURAN) 50 MG tablet - doxycycline (MONODOX) 100 MG capsule - finasteride (PROSCAR) 5 MG tablet - OXYGEN Inhale 3 L into the lungs continuous. - pantoprazole (PROTONIX) 40 MG tablet, take one the night before and one on the morning of surgery - helps to prevent nausea after surgery. - predniSONE (DELTASONE) 10 MG tablet - umeclidinium-vilanterol (ANORO ELLIPTA) 62.5-25 MCG/INH AEPB  Use inhaler albuterol (PROVENTIL HFA;VENTOLIN HFA) 108 (90 Base) MCG/ACT inhaler on the day of surgery and bring to the hospital.  Follow recommendations from Cardiologist, Pulmonologist or PCP regarding stopping Aspirin, Coumadin, Plavix, Eliquis, Pradaxa, or Pletal. Patient to stop Plavix and Aspirin beginning 12/05/20.  One week prior to surgery: Stop Anti-inflammatories (NSAIDS) such as Advil, Aleve, Ibuprofen, Motrin, Naproxen, Naprosyn and Aspirin based products such as Excedrin, Goodys Powder, BC Powder.  Stop ANY OVER THE COUNTER supplements until after surgery.  You may take Tylenol if needed for pain up until the day of surgery.  No Alcohol for 24 hours before or after surgery.  No Smoking including e-cigarettes for 24 hours prior to surgery.  No chewable tobacco products for at least 6 hours prior to surgery.  No nicotine patches on the day of surgery.  Do not use any  "recreational" drugs for at least a week prior to your surgery.  Please be advised that the combination of cocaine and anesthesia may have negative outcomes, up to and including death. If you test positive for cocaine, your surgery will be cancelled.  On the morning of surgery brush your teeth with toothpaste and water, you may rinse your mouth with mouthwash if you wish. Do not swallow any toothpaste or mouthwash.  Do not wear jewelry, make-up, hairpins, clips or nail polish.  Do not wear lotions, powders, or perfumes.   Do not shave body from the neck down 48 hours prior to surgery just in case you cut yourself which could leave a site for infection.  Also, freshly shaved skin may become irritated if using the CHG soap.  Contact lenses, hearing aids and dentures may not be worn into surgery.  Do not bring valuables to the hospital. [facility] is not responsible for any missing/lost belongings or valuables.   Bring your C-PAP to the hospital with you in case you may have to spend the night.   Notify your doctor if there is any change in your medical condition (cold, fever, infection).  Wear comfortable clothing (specific to your surgery type) to the hospital.  Plan for stool softeners for home use; pain medications have a tendency to cause constipation. You can also help prevent constipation by eating foods high in fiber such as fruits and vegetables and drinking plenty of fluids as your diet allows.  After surgery, you can help prevent lung complications by doing breathing exercises.  Take deep breaths and cough every 1-2 hours. Your doctor may order  a device called an Incentive Spirometer to help you take deep breaths. When coughing or sneezing, hold a pillow firmly against your incision with both hands. This is called "splinting." Doing this helps protect your incision. It also decreases belly discomfort.  If you are being admitted to the hospital overnight, leave your suitcase in  the car. After surgery it may be brought to your room.  If you are being discharged the day of surgery, you will not be allowed to drive home. You will need a responsible adult (18 years or older) to drive you home and stay with you that night.   If you are taking public transportation, you will need to have a responsible adult (18 years or older) with you. Please confirm with your physician that it is acceptable to use public transportation.   Please call the [facility] at [phone] if you have any questions about these instructions.  Surgery Visitation Policy:  Patients undergoing a surgery or procedure may have one family member or support person with them as long as that person is not COVID-19 positive or experiencing its symptoms.  That person may remain in the waiting area during the procedure.  Inpatient Visitation:    Visiting hours are 7 a.m. to 8 p.m. Inpatients will be allowed two visitors daily. The visitors may change each day during the patient's stay. No visitors under the age of 47. Any visitor under the age of 77 must be accompanied by an adult. The visitor must pass COVID-19 screenings, use hand sanitizer when entering and exiting the patient's room and wear a mask at all times, including in the patient's room. Patients must also wear a mask when staff or their visitor are in the room. Masking is required regardless of vaccination status.

## 2020-12-10 NOTE — Progress Notes (Signed)
Perioperative Services  Pre-Admission/Anesthesia Testing Clinical Review  Date: 12/10/20  Patient Demographics:  Name: Jay Watkins DOB:   Dec 02, 1940 MRN:   [id number]  Planned Surgical Procedure(s):   Choose an anesthesia record to view details      NOTE: Available PAT nursing documentation and vital signs have been reviewed. Clinical nursing staff has updated patient's PMH/PSHx, current medication list, and drug allergies/intolerances to ensure comprehensive history available to assist in medical decision making as it pertains to the aforementioned surgical procedure and anticipated anesthetic course. Extensive review of available clinical information performed. Jay Watkins PMH and PSHx updated with any diagnoses/procedures that  may have been inadvertently omitted during his intake with the pre-admission testing department's nursing staff.  Clinical Discussion:  Jay Watkins is a 80 y.o. male who is submitted for pre-surgical anesthesia review and clearance prior to him undergoing the above procedure. Patient has never been a smoker. Pertinent PMH includes: CAD, SSS (s/p PPM placement), CVA, TAA, PVD, aortic atherosclerosis, symptomatic PACs, PFO, RBBB, hypotension, HLD, prediabetes, dysphagia, GERD (on daily PPI), crest syndrome, COPD, shortness of breath, supplemental oxygen dependent (3L/[place] continuous), memory loss..   Patient is followed by cardiology Saralyn Pilar, MD). He was last seen in the cardiology clinic on 09/23/2020; notes reviewed.  At the time of his clinic visit, patient "doing okay" from a cardiovascular perspective.  Patient denied any episodes of chest pain, however he continued to experience chronic exertional dyspnea related to his underlying COPD diagnosis.  Patient is followed by pulmonary medicine Raul Del, MD) and utilizes supplemental oxygen ATC.  Patient denied PND, orthopnea, palpitations, and recent episodes of presyncope/syncope.  Patient with occasional  palpitations and chronic peripheral edema.  He has an OSAH diagnosis and is compliant with his prescribed nocturnal PAP therapy.  PMH significant for cardiovascular diagnoses.  Patient suffered a CVA in 09/2014.  (+) history of sick sinus syndrome. Patient has undergone multiple pacemaker placement/generator exchanges. Last procedure was in 03/2015.  Myocardial perfusion imaging study performed on 07/08/2018 revealed a small perfusion abnormality in the inferior region on stress imaging.  Left ventricular function was normal with an EF of 68%.  Patient underwent a diagnostic RIGHT heart catheterization on 09/03/2018 that revealed normal right-sided heart pressures.  Findings consistent with tricuspid valve regurgitation.  There was no evidence of PAH; mean PA pressure was 5 mmHg.  TTE was performed on 07/30/2019 that demonstrated normal global systolic function with mild valvular insufficiency.  There was no evidence of valvular stenosis.  LVEF was normal at 55%.  Patient with known PVD.  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  Patient with a history of orthostatic hypotension for which he takes midodrine twice a day.  Patient is on GDMT for his HLD; uses atorvastatin. OSAH well controlled; uses nocturnal PAP therapy as prescribed. Functional capacity, as defined by DASI, is documented as being </= 4 METS.  No changes were made to patient's medication regimen.  Patient to follow-up with outpatient cardiology in 4 months or sooner if needed.  Patient is scheduled for an elective granuloma excision on 12/13/2020 with Dr. Herbert Pun.  Given patient's past medical history significant for cardiovascular diagnoses, presurgical cardiac clearance was sought by the performing surgeon's office and PAT team. Per cardiology, "this patient is optimized for surgery  and may proceed with the planned procedural course with a LOW to MODERATE risk stratification". This  patient is on daily DAPT therapy (ASA + clopidogrel). He has been instructed on recommendations for holding these medications for 5 days prior to his procedure with plans to restart as soon as postoperative bleeding risk felt to be minimized by his attending surgeon. The patient has been instructed that his last dose of his anticoagulant will be on 12/07/2020.  Patient denies previous perioperative complications with anesthesia in the past. In review of the available records, it is noted that patient underwent a general anesthetic course here (ASA IV) in 02/2019 without documented complications.   Vitals with BMI 03/03/2019 03/03/2019 03/03/2019  Height - - -  Weight - - -  BMI - - -  Systolic 235 361 97  Diastolic 83 65 71  Pulse 59 59 59    Providers/Specialists:   NOTE: Primary physician provider listed below. Patient may have been seen by APP or partner within same practice.   PROVIDER ROLE / SPECIALTY LAST Tanna Savoy, MD  General Surgery 12/07/2020  Dion Body, MD  Primary Care Provider 11/09/2020  Isaias Cowman, MD  Cardiology 09/23/2020  Wallene Huh, MD  Pulmonary Medicine 11/25/2020  Murlean Iba, MD  Nephrology 09/21/2020  Ephriam Jenkins, MD  Rheumatology 09/01/2020   Allergies:  Cellcept [mycophenolate], Vioxx [rofecoxib], and Ciprofloxacin  Current Home Medications:   No current facility-administered medications for this encounter.    albuterol (PROVENTIL HFA;VENTOLIN HFA) 108 (90 Base) MCG/ACT inhaler   albuterol (PROVENTIL) (2.5 MG/3ML) 0.083% nebulizer solution   aspirin EC 81 MG tablet   atorvastatin (LIPITOR) 80 MG tablet   azaTHIOprine (IMURAN) 50 MG tablet   clopidogrel (PLAVIX) 75 MG tablet   doxycycline (MONODOX) 100 MG capsule   finasteride (PROSCAR) 5 MG tablet   midodrine (PROAMATINE) 10 MG tablet   OXYGEN   pantoprazole  (PROTONIX) 40 MG tablet   predniSONE (DELTASONE) 10 MG tablet   umeclidinium-vilanterol (ANORO ELLIPTA) 62.5-25 MCG/INH AEPB   vitamin B-12 (CYANOCOBALAMIN) 500 MCG tablet   History:   Past Medical History:  Diagnosis Date   Aortic atherosclerosis (HCC)    Asbestos exposure    Atrial premature contractions    symptomatic   B12 deficiency    BPH with urinary obstruction    lower urinary tract symptoms   CAD (coronary artery disease)    Carpal tunnel syndrome    Chronic anticoagulation    DAPT (ASA + clopidogrel)   COPD, moderate (HCC)    CREST (calcinosis, Raynaud's phenomenon, esophageal dysfunction, sclerodactyly, telangiectasia) (HCC)    Difficulty walking    Diverticulosis    Dysphagia    Dyspnea    Edema    Elevated PSA    GERD (gastroesophageal reflux disease)    Gouty arthropathy    History of kidney stones    Hyperlipidemia    Hypotension    Memory loss    Onychomycosis    Orthostatic hypotension    Patent foramen ovale    Peripheral neuropathy    idopathic   Pre-diabetes    Presence of permanent cardiac pacemaker    Proteinuria    PVD (peripheral vascular disease) (HCC)    RBBB    Renal calculus    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HCC)    Scleroderma (HCC)    Sleep apnea with use of continuous positive airway pressure (CPAP)    Spondylosis of cervical spine    SSS (sick sinus syndrome) (HCC)    PPM in place   Stasis dermatitis    Stroke ([place]) 09/2014   Supplemental oxygen  dependent    3L/[place] continuous   Thoracic aortic aneurysm (HCC)    a.) measured 4.0 x 4.0 cm on 03/16/2020   Ulcer of lower limb (HCC)    chronic   Urinary frequency    Varicose veins of leg with edema    Past Surgical History:  Procedure Laterality Date   ANKLE FUSION Left    COLONOSCOPY WITH PROPOFOL N/A 12/29/2016   Procedure: COLONOSCOPY WITH PROPOFOL;  Surgeon: Lollie Sails, MD;  Location: [facility] ENDOSCOPY;  Service: Endoscopy;  Laterality: N/A;   COLONOSCOPY WITH PROPOFOL N/A 01/16/2017    Procedure: COLONOSCOPY WITH PROPOFOL;  Surgeon: Lollie Sails, MD;  Location: [facility] ENDOSCOPY;  Service: Endoscopy;  Laterality: N/A;   COLONOSCOPY WITH PROPOFOL N/A 03/03/2019   Procedure: COLONOSCOPY WITH PROPOFOL;  Surgeon: Lollie Sails, MD;  Location: [facility] ENDOSCOPY;  Service: Endoscopy;  Laterality: N/A;   ESOPHAGOGASTRODUODENOSCOPY (EGD) WITH PROPOFOL N/A 12/29/2016   Procedure: ESOPHAGOGASTRODUODENOSCOPY (EGD) WITH PROPOFOL;  Surgeon: Lollie Sails, MD;  Location: [facility] ENDOSCOPY;  Service: Endoscopy;  Laterality: N/A;   ESOPHAGOGASTRODUODENOSCOPY (EGD) WITH PROPOFOL N/A 03/03/2019   Procedure: ESOPHAGOGASTRODUODENOSCOPY (EGD) WITH PROPOFOL;  Surgeon: Lollie Sails, MD;  Location: [facility] ENDOSCOPY;  Service: Endoscopy;  Laterality: N/A;   HEMORRHOID SURGERY     INSERT / REPLACE / REMOVE PACEMAKER     neck fusion     cervical spine   RIGHT HEART CATH N/A 09/03/2018   Procedure: RIGHT HEART CATH;  Surgeon: Isaias Cowman, MD;  Location: [facility];  Service: Cardiovascular;  Laterality: N/A;   Family History  Problem Relation Age of Onset   Pancreatic cancer Mother    Cancer Sister    Alzheimer's disease Sister    ALS Sister    Lung cancer Brother    Parkinsonism Brother    Social History   Tobacco Use   Smoking status: Never   Smokeless tobacco: Never  Substance Use Topics   Alcohol use: No   Drug use: No    Pertinent Clinical Results:  LABS: Labs reviewed: Acceptable for surgery.   Ref Range & Units 3 mo ago  Glucose 70 - 110 mg/dL 87   Sodium 136 - 145 mmol/L 140   Potassium 3.6 - 5.1 mmol/L 4.0   Chloride 97 - 109 mmol/L 102   Carbon Dioxide (CO2) 22.0 - 32.0 mmol/L 31.8   Urea Nitrogen (BUN) 7 - 25 mg/dL 15   Creatinine 0.7 - 1.3 mg/dL 1.2   Glomerular Filtration Rate (eGFR), MDRD Estimate >60 mL/min/1.73sq m 59 Low    Calcium 8.7 - 10.3 mg/dL 10.3   AST  8 - 39 U/L 21   ALT  6 - 57 U/L 21   Alk Phos (alkaline Phosphatase) 34 -  104 U/L 89   Albumin 3.5 - 4.8 g/dL 4.1   Bilirubin, Total 0.3 - 1.2 mg/dL 1.4 High    Protein, Total 6.1 - 7.9 g/dL 6.6   A/G Ratio 1.0 - 5.0 gm/dL 1.6   Resulting Agency  [facility]  Specimen Collected: 08/17/20 08:43 Last Resulted: 08/17/20 11:03  Received From: [facility]  Result Received: 10/20/20 13:51    Ref Range & Units 7 mo ago  WBC (White Blood Cell Count) 4.1 - 10.2 10^3/uL 8.7   RBC (Red Blood Cell Count) 4.69 - 6.13 10^6/uL 4.91   Hemoglobin 14.1 - 18.1 gm/dL 14.5   Hematocrit 40.0 - 52.0 % 46.5   MCV (Mean Corpuscular Volume)  80.0 - 100.0 fl 94.7   MCH (Mean Corpuscular Hemoglobin) 27.0 - 31.2 pg 29.5   MCHC (Mean Corpuscular Hemoglobin Concentration) 32.0 - 36.0 gm/dL 31.2 Low    Platelet Count 150 - 450 10^3/uL 363   RDW-CV (Red Cell Distribution Width) 11.6 - 14.8 % 15.5 High    MPV (Mean Platelet Volume) 9.4 - 12.4 fl 9.1 Low    Neutrophils 1.50 - 7.80 10^3/uL 6.00   Lymphocytes 1.00 - 3.60 10^3/uL 1.67   Monocytes 0.00 - 1.50 10^3/uL 0.66   Eosinophils 0.00 - 0.55 10^3/uL 0.24   Basophils 0.00 - 0.09 10^3/uL 0.09   Neutrophil % 32.0 - 70.0 % 69.2   Lymphocyte % 10.0 - 50.0 % 19.2   Monocyte % 4.0 - 13.0 % 7.6   Eosinophil % 1.0 - 5.0 % 2.8   Basophil% 0.0 - 2.0 % 1.0   Immature Granulocyte % <=0.7 % 0.2   Immature Granulocyte Count <=0.06 10^3/L 0.02   Resulting Agency  [facility] - [facility]  Specimen Collected: 05/04/20 09:31 Last Resulted: 05/04/20 10:10  Received From: [facility]  Result Received: 10/20/20 13:51     ECG: Date: 09/03/2018 Time ECG obtained: 0814 AM Rate: 81 bpm Rhythm: normal sinus; RBBB Axis (leads I and aVF): Normal Intervals: QRS 142 ms. QTc 462 ms. ST segment and T wave changes: No evidence of acute ST segment elevation or depression Comparison: Similar to previous tracing obtained on 06/13/2018   IMAGING / PROCEDURES: PULMONARY FUNCTION TESTING performed on  09/02/2020 FVC 2.68 liters ( 66%) FEV1 1.75 liters ( 57 %) Ratio 65 FEF 30 % Expiratory flow volume looks delayed consistent with underlying obstruction  CT CHEST WITHOUT CONTRAST performed on 03/16/2020 Mild, predominantly bandlike scarring of the dependent lung bases.  No findings to suggest fibrotic interstitial lung disease.  No significant air trapping on expiratory phase imaging. Stable, definitively benign small pulmonary nodules. No further routine follow-up is required for these nodules. No significant interval change in enlargement of the tubular ascending thoracic aorta, measuring 4.0 x 4.0 cm. Recommend annual imaging follow up by CTA or MRA.  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 Small, unchanged pericardial effusion  TRANSTHORACIC ECHOCARDIOGRAM performed on 07/30/2019 LVEF 55% Normal left ventricular systolic function Normal right ventricular systolic function Mild AR Trivial MR, TR, PR No evidence of valvular stenosis No evidence of a pericardial effusion  RIGHT HEART CATHETERIZATION AND CORONARY ANGIOGRAPHY performed on 09/03/2018 Normal right-sided heart pressures with exception of the leg noted on right atrial pressure tracing consistent with tricuspid regurgitation No evidence of pulmonary hypertension with a mean PA pressure of 5 mmHg  LEXISCAN performed on 07/08/2018 LVEF 68% Regional wall motion reveals normal myocardial thickening and wall motion No artifacts noted Left ventricular cavity size normal Small perfusion abnormality of mild intensity present in the inferior region on stress images consistent with ischemia The overall quality of the study is good  Impression and Plan:  Jay Watkins has been referred for pre-anesthesia review and clearance prior to him undergoing the planned anesthetic and procedural courses. Available labs, pertinent testing, and imaging results were  personally reviewed by me. This patient will require updated lab testing and ECG on the day of surgery.  Patient unable to come in to PAT office on 12/10/2020 as requested.  Wife advising that she is unable to bring patient as she is in a facility and patient has dementia.  They have discussed this  with the primary surgeon's office and were advised that they can have preoperative testing performed on the day of his procedure.  STS staff has been notified that patient will need to arrive early in order to have the aforementioned testing performed prior to his planned surgical/anesthetic course.  This patient has been appropriately cleared by cardiology with an overall MODERATE risk of significant perioperative cardiovascular complications. Completed perioperative prescription for cardiac device management documentation completed by primary cardiology team and placed on patient's chart for review by the surgical/anesthetic team on the day of his procedure. Based on clinical review performed today (12/10/20), barring any significant acute changes in the patient's overall condition, it is anticipated that he will be able to proceed with the planned surgical intervention. Any acute changes in clinical condition may necessitate his procedure being postponed and/or cancelled. Patient will meet with anesthesia team (MD and/or CRNA) on the day of his procedure for preoperative evaluation/assessment. Questions regarding anesthetic course will be fielded at that time.   Pre-surgical instructions were reviewed with the patient during his PAT appointment and questions were fielded by PAT clinical staff. Patient was advised that if any questions or concerns arise prior to his procedure then he should return a call to PAT and/or his surgeon's office to discuss.  Honor Loh, MSN, APRN, FNP-C, CEN [facility]  Peri-operative Services Nurse Practitioner Phone: [phone] 12/10/20 1:05 PM  NOTE: This  note has been prepared using Dragon dictation software. Despite my best ability to proofread, there is always the potential that unintentional transcriptional errors may still occur from this process.

## 2020-12-12 MED ORDER — CHLORHEXIDINE GLUCONATE 0.12 % MT SOLN
15.0000 mL | Freq: Once | OROMUCOSAL | Status: AC
  Administered 2020-12-13: 15 mL via OROMUCOSAL

## 2020-12-12 MED ORDER — LACTATED RINGERS IV SOLN: INTRAVENOUS | Status: DC

## 2020-12-12 MED ORDER — CEFAZOLIN SODIUM-DEXTROSE 2-4 GM/100ML-% IV SOLN: 2.0000 g | INTRAVENOUS | Status: DC

## 2020-12-12 MED ORDER — ORAL CARE MOUTH RINSE: 15.0000 mL | Freq: Once | OROMUCOSAL | Status: AC

## 2020-12-13 ENCOUNTER — Encounter
Admission: RE | Disposition: A | Discharge status: Home / Self Care | Payer: Self-pay | Source: Home / Self Care | Attending: General Surgery

## 2020-12-13 ENCOUNTER — Encounter: Payer: Self-pay | Admitting: General Surgery

## 2020-12-13 ENCOUNTER — Ambulatory Visit
Admission: RE | Admit: 2020-12-13 | Discharge: 2020-12-13 | Disposition: A | Discharge status: Home / Self Care | Payer: PPO | Attending: General Surgery | Admitting: General Surgery

## 2020-12-13 ENCOUNTER — Ambulatory Visit: Payer: PPO | Admitting: Urgent Care

## 2020-12-13 ENCOUNTER — Other Ambulatory Visit: Payer: Self-pay

## 2020-12-13 DIAGNOSIS — Z7951 Long term (current) use of inhaled steroids: Secondary | ICD-10-CM | POA: Insufficient documentation

## 2020-12-13 DIAGNOSIS — J449 Chronic obstructive pulmonary disease, unspecified: Secondary | ICD-10-CM | POA: Diagnosis not present

## 2020-12-13 DIAGNOSIS — Z801 Family history of malignant neoplasm of trachea, bronchus and lung: Secondary | ICD-10-CM | POA: Insufficient documentation

## 2020-12-13 DIAGNOSIS — T8189XS Other complications of procedures, not elsewhere classified, sequela: Secondary | ICD-10-CM | POA: Diagnosis not present

## 2020-12-13 DIAGNOSIS — Z7902 Long term (current) use of antithrombotics/antiplatelets: Secondary | ICD-10-CM | POA: Insufficient documentation

## 2020-12-13 DIAGNOSIS — Z79899 Other long term (current) drug therapy: Secondary | ICD-10-CM | POA: Insufficient documentation

## 2020-12-13 DIAGNOSIS — Z950 Presence of cardiac pacemaker: Secondary | ICD-10-CM | POA: Diagnosis not present

## 2020-12-13 DIAGNOSIS — Z888 Allergy status to other drugs, medicaments and biological substances status: Secondary | ICD-10-CM | POA: Diagnosis not present

## 2020-12-13 DIAGNOSIS — Z809 Family history of malignant neoplasm, unspecified: Secondary | ICD-10-CM | POA: Diagnosis not present

## 2020-12-13 DIAGNOSIS — Z7982 Long term (current) use of aspirin: Secondary | ICD-10-CM | POA: Insufficient documentation

## 2020-12-13 DIAGNOSIS — X58XXXA Exposure to other specified factors, initial encounter: Secondary | ICD-10-CM | POA: Insufficient documentation

## 2020-12-13 DIAGNOSIS — Z9981 Dependence on supplemental oxygen: Secondary | ICD-10-CM | POA: Insufficient documentation

## 2020-12-13 DIAGNOSIS — Z820 Family history of epilepsy and other diseases of the nervous system: Secondary | ICD-10-CM | POA: Insufficient documentation

## 2020-12-13 DIAGNOSIS — R54 Age-related physical debility: Secondary | ICD-10-CM | POA: Diagnosis not present

## 2020-12-13 DIAGNOSIS — T8189XA Other complications of procedures, not elsewhere classified, initial encounter: Secondary | ICD-10-CM | POA: Insufficient documentation

## 2020-12-13 DIAGNOSIS — L928 Other granulomatous disorders of the skin and subcutaneous tissue: Secondary | ICD-10-CM | POA: Diagnosis not present

## 2020-12-13 DIAGNOSIS — Z881 Allergy status to other antibiotic agents status: Secondary | ICD-10-CM | POA: Insufficient documentation

## 2020-12-13 DIAGNOSIS — Z800 Family history of malignant neoplasm of digestive organs: Secondary | ICD-10-CM | POA: Diagnosis not present

## 2020-12-13 DIAGNOSIS — K219 Gastro-esophageal reflux disease without esophagitis: Secondary | ICD-10-CM | POA: Diagnosis not present

## 2020-12-13 HISTORY — DX: Dependence on supplemental oxygen: Z99.81

## 2020-12-13 HISTORY — DX: Polyneuropathy, unspecified: G62.9

## 2020-12-13 HISTORY — DX: Atrial premature depolarization: I49.1

## 2020-12-13 HISTORY — DX: Atherosclerosis of aorta: I70.0

## 2020-12-13 HISTORY — PX: MASS EXCISION: SHX2000

## 2020-12-13 HISTORY — DX: Sick sinus syndrome: I49.5

## 2020-12-13 HISTORY — DX: Peripheral vascular disease, unspecified: I73.9

## 2020-12-13 HISTORY — DX: Sleep apnea, unspecified: G47.30

## 2020-12-13 HISTORY — DX: Thoracic aortic aneurysm, without rupture, unspecified: I71.20

## 2020-12-13 HISTORY — DX: Long term (current) use of anticoagulants: Z79.01

## 2020-12-13 HISTORY — DX: Thoracic aortic aneurysm, without rupture: I71.2

## 2020-12-13 HISTORY — DX: Atherosclerotic heart disease of native coronary artery without angina pectoris: I25.10

## 2020-12-13 HISTORY — DX: Unspecified right bundle-branch block: I45.10

## 2020-12-13 HISTORY — DX: Cr(e)st syndrome: M34.1

## 2020-12-13 LAB — CBC
HCT: 44 % (ref 39.0–52.0)
Hemoglobin: 14.1 g/dL (ref 13.0–17.0)
MCH: 30.6 pg (ref 26.0–34.0)
MCHC: 32 g/dL (ref 30.0–36.0)
MCV: 95.4 fL (ref 80.0–100.0)
Platelets: 251 10*3/uL (ref 150–400)
RBC: 4.61 MIL/uL (ref 4.22–5.81)
RDW: 15.5 % (ref 11.5–15.5)
WBC: 6.5 10*3/uL (ref 4.0–10.5)
nRBC: 0 % (ref 0.0–0.2)

## 2020-12-13 LAB — BASIC METABOLIC PANEL
Anion gap: 7 (ref 5–15)
BUN: 19 mg/dL (ref 8–23)
CO2: 27 mmol/L (ref 22–32)
Calcium: 9.3 mg/dL (ref 8.9–10.3)
Chloride: 109 mmol/L (ref 98–111)
Creatinine, Ser: 1.14 mg/dL (ref 0.61–1.24)
GFR, Estimated: >60 mL/min (ref >60)
Glucose, Bld: 78 mg/dL (ref 70–99)
Potassium: 3.6 mmol/L (ref 3.5–5.1)
Sodium: 143 mmol/L (ref 135–145)

## 2020-12-13 SURGERY — EXCISION MASS
Anesthesia: General | Site: Chest | Wound class: Clean

## 2020-12-13 MED ORDER — BUPIVACAINE-EPINEPHRINE (PF) 0.5% -1:200000 IJ SOLN
INTRAMUSCULAR | Status: AC
  Filled 2020-12-13: qty 30

## 2020-12-13 MED ORDER — LACTATED RINGERS IV SOLN: INTRAVENOUS | Status: DC | PRN

## 2020-12-13 MED ORDER — FENTANYL CITRATE (PF) 100 MCG/2ML IJ SOLN
INTRAMUSCULAR | Status: AC
  Filled 2020-12-13: qty 2

## 2020-12-13 MED ORDER — CEFAZOLIN SODIUM-DEXTROSE 2-4 GM/100ML-% IV SOLN
INTRAVENOUS | Status: AC
  Filled 2020-12-13: qty 100

## 2020-12-13 MED ORDER — HYDROCODONE-ACETAMINOPHEN 5-325 MG PO TABS: 1.0000 | ORAL_TABLET | ORAL | 0 refills | Status: AC | PRN

## 2020-12-13 MED ORDER — BUPIVACAINE-EPINEPHRINE 0.5% -1:200000 IJ SOLN
INTRAMUSCULAR | Status: DC | PRN
  Administered 2020-12-13: 22 mL

## 2020-12-13 MED ORDER — PROPOFOL 500 MG/50ML IV EMUL
INTRAVENOUS | Status: DC | PRN
  Administered 2020-12-13 (×5): 20 mg via INTRAVENOUS

## 2020-12-13 MED ORDER — PROPOFOL 500 MG/50ML IV EMUL
INTRAVENOUS | Status: AC
  Filled 2020-12-13: qty 50

## 2020-12-13 MED ORDER — PROPOFOL 500 MG/50ML IV EMUL
INTRAVENOUS | Status: DC | PRN
  Administered 2020-12-13: 20 ug/kg/min via INTRAVENOUS

## 2020-12-13 MED ORDER — MIDAZOLAM HCL 2 MG/2ML IJ SOLN
INTRAMUSCULAR | Status: AC
  Filled 2020-12-13: qty 2

## 2020-12-13 MED ORDER — CHLORHEXIDINE GLUCONATE 0.12 % MT SOLN
OROMUCOSAL | Status: AC
  Filled 2020-12-13: qty 15

## 2020-12-13 SURGICAL SUPPLY — 34 items
ADH SKN CLS APL DERMABOND .7 (GAUZE/BANDAGES/DRESSINGS)
APL PRP STRL LF DISP 70% ISPRP (MISCELLANEOUS) ×1
BLADE SURG 15 STRL LF DISP TIS (BLADE) ×1 IMPLANT
BLADE SURG 15 STRL SS (BLADE) ×2
CHLORAPREP W/TINT 26 (MISCELLANEOUS) ×2 IMPLANT
CNTNR SPEC 2.5X3XGRAD LEK (MISCELLANEOUS)
CONT SPEC 4OZ STER OR WHT (MISCELLANEOUS)
CONT SPEC 4OZ STRL OR WHT (MISCELLANEOUS)
CONTAINER SPEC 2.5X3XGRAD LEK (MISCELLANEOUS) IMPLANT
COVER WAND RF STERILE (DRAPES) ×2 IMPLANT
DERMABOND ADVANCED (GAUZE/BANDAGES/DRESSINGS)
DERMABOND ADVANCED .7 DNX12 (GAUZE/BANDAGES/DRESSINGS) IMPLANT
DRAPE LAPAROTOMY 100X77 ABD (DRAPES) ×2 IMPLANT
DRAPE UNDER BUTTOCK W/FLU (DRAPES) IMPLANT
ELECT REM PT RETURN 9FT ADLT (ELECTROSURGICAL) ×2
ELECTRODE REM PT RTRN 9FT ADLT (ELECTROSURGICAL) ×1 IMPLANT
GLOVE SURG ENC MOIS LTX SZ6.5 (GLOVE) ×6 IMPLANT
GLOVE SURG UNDER POLY LF SZ6.5 (GLOVE) ×6 IMPLANT
GOWN STRL REUS W/ TWL LRG LVL4 (GOWN DISPOSABLE) ×1 IMPLANT
GOWN STRL REUS W/TWL LRG LVL4 (GOWN DISPOSABLE) ×2
KIT TURNOVER KIT A (KITS) ×2 IMPLANT
LABEL OR SOLS (LABEL) ×2 IMPLANT
MANIFOLD NEPTUNE II (INSTRUMENTS) ×2 IMPLANT
MARGIN MAP 10MM (MISCELLANEOUS) IMPLANT
NEEDLE HYPO 25X1 1.5 SAFETY (NEEDLE) ×2 IMPLANT
NS IRRIG 500ML POUR BTL (IV SOLUTION) ×2 IMPLANT
PACK BASIN MINOR ARMC (MISCELLANEOUS) ×2 IMPLANT
SUT ETHILON 3-0 (SUTURE) IMPLANT
SUT MNCRL 4-0 (SUTURE) ×2
SUT MNCRL 4-0 27XMFL (SUTURE) ×1
SUT VIC AB 2-0 SH 27 (SUTURE) ×2
SUT VIC AB 2-0 SH 27XBRD (SUTURE) ×1 IMPLANT
SUTURE MNCRL 4-0 27XMF (SUTURE) ×1 IMPLANT
SYR 10ML LL (SYRINGE) ×2 IMPLANT

## 2020-12-13 NOTE — Op Note (Signed)
Pre operative diagnosis: Left chest wound granuloma  Post operative diagnosis: Left chest wound granuloma  Procedure: Excision of left chest granuloma with rearrangement of tissue  Anesthesia: Local and sedation   Surgeon: Dr. Windell Moment   Indication: This 80 year old male with a soft tissue granuloma that with chronic drainage and just on top of the pacemaker lead. Excision was indicated to avoid exposure of the lead or pacemaker and avoid infection.    Description of procedure: after orienting patient about the procedure steps and benefits and patient agreed to proceed. Time out was done identifying correct patient and location of procedure. Patient was placed in supine position. Sedation given. Local anesthesia was infiltrated around the palpable lesion. With a blade #15, an elliptical incision was made using the skin lines. Sharp dissection was carried down and lesion was excised including dermal and fat tissue. The excised tissue measured 3 cm. The deep tissue down to pectoralis fascia was mobilized circumferentially to an area of 9 sq cm. The mobilized tissue was approximated over the lead to create an additional layer between the skin and the lead. The subcutaneous tissue was approximated with 2-0 Vicryl and skin closed with Monocryl 4-0 in subcuticular fashion. Specimen sent to pathology.    Complications: none   EBL: 5 mL

## 2020-12-13 NOTE — Discharge Instructions (Addendum)
  Diet: Resume home heart healthy regular diet.   Activity: Increase activity as tolerated. Light activity and walking are encouraged. Do not drive or drink alcohol if taking narcotic pain medications.  Wound care: May shower with soapy water and pat dry (do not rub incisions), but no baths or submerging incision underwater until follow-up. (no swimming)   Medications: Resume all home medications. For mild to moderate pain: acetaminophen (Tylenol) or ibuprofen (if no kidney disease). Combining Tylenol with alcohol can substantially increase your risk of causing liver disease. Narcotic pain medications, if prescribed, can be used for severe pain, though may cause nausea, constipation, and drowsiness. Do not combine Tylenol and Norco within a 6 hour period as Norco contains Tylenol. If you do not need the narcotic pain medication, you do not need to fill the prescription.  Call office ([phone]) at any time if any questions, worsening pain, fevers/chills, bleeding, drainage from incision site, or other concerns.   AMBULATORY SURGERY  DISCHARGE INSTRUCTIONS   The drugs that you were given will stay in your system until tomorrow so for the next 24 hours you should not:  Drive an automobile Make any legal decisions Drink any alcoholic beverage   You may resume regular meals tomorrow.  Today it is better to start with liquids and gradually work up to solid foods.  You may eat anything you prefer, but it is better to start with liquids, then soup and crackers, and gradually work up to solid foods.   Please notify your doctor immediately if you have any unusual bleeding, trouble breathing, redness and pain at the surgery site, drainage, fever, or pain not relieved by medication.    Additional Instructions:        Please contact your physician with any problems or Same Day Surgery at [phone], Monday through Friday 6 am to 4 pm, or [facility] Main number at  [phone]. 

## 2020-12-13 NOTE — Anesthesia Postprocedure Evaluation (Signed)
Anesthesia Post Note  Patient: Jay Watkins  Procedure(s) Performed: EXCISION of stitch granuloma w/ transfer of tissue (Chest)  Patient location during evaluation: PACU Anesthesia Type: General Level of consciousness: awake and alert Pain management: pain level controlled Vital Signs Assessment: post-procedure vital signs reviewed and stable Respiratory status: spontaneous breathing and respiratory function stable Cardiovascular status: stable Anesthetic complications: no   No notable events documented.   Last Vitals:  Vitals:   12/13/20 0830 12/13/20 1100  BP: (!) 148/83 135/77  Pulse: 74 68  Resp: 20 15  Temp: (!) 36.3 C (!) 36.1 C  SpO2: 99% 96%    Last Pain:  Vitals:   12/13/20 1100  TempSrc:   PainSc: 0-No pain                 Mylin Hirano K

## 2020-12-13 NOTE — Interval H&P Note (Signed)
History and Physical Interval Note:  12/13/2020 9:42 AM  Ardine Eng  has presented today for surgery, with the diagnosis of T81.89XS suture granuloma, sequela.  The various methods of treatment have been discussed with the patient and family. After consideration of risks, benefits and other options for treatment, the patient has consented to  Procedure(s): EXCISION of stitch granuloma w/ transfer of tissue (N/A) as a surgical intervention.  The patient's history has been reviewed, patient examined, no change in status, stable for surgery.  I have reviewed the patient's chart and labs.  Questions were answered to the patient's satisfaction.     Herbert Pun

## 2020-12-13 NOTE — Anesthesia Preprocedure Evaluation (Addendum)
Anesthesia Evaluation  Patient identified by MRN, date of birth, ID band Patient awake    Reviewed: Allergy & Precautions, NPO status , Patient's Chart, lab work & pertinent test results  History of Anesthesia Complications Negative for: history of anesthetic complications  Airway Mallampati: III       Dental   Pulmonary sleep apnea and Continuous Positive Airway Pressure Ventilation , COPD,  COPD inhaler and oxygen dependent,           Cardiovascular (-) hypertension+ CAD  (-) CHF + dysrhythmias + pacemaker      Neuro/Psych neg Seizures CVA (R facial droop, weakness ? laterallity), No Residual Symptoms    GI/Hepatic Neg liver ROS, GERD  Medicated and Controlled,  Endo/Other  neg diabetes  Renal/GU Renal disease (stones)     Musculoskeletal   Abdominal   Peds  Hematology   Anesthesia Other Findings   Reproductive/Obstetrics                            Anesthesia Physical Anesthesia Plan  ASA: 4  Anesthesia Plan: General   Post-op Pain Management:    Induction: Intravenous  PONV Risk Score and Plan: 2 and Propofol infusion  Airway Management Planned: Nasal Cannula  Additional Equipment:   Intra-op Plan:   Post-operative Plan:   Informed Consent: I have reviewed the patients History and Physical, chart, labs and discussed the procedure including the risks, benefits and alternatives for the proposed anesthesia with the patient or authorized representative who has indicated his/her understanding and acceptance.       Plan Discussed with:   Anesthesia Plan Comments:         Anesthesia Quick Evaluation

## 2020-12-13 NOTE — Transfer of Care (Signed)
Immediate Anesthesia Transfer of Care Note  Patient: Jay Watkins  Procedure(s) Performed: EXCISION of stitch granuloma w/ transfer of tissue (Chest)  Patient Location: PACU  Anesthesia Type:MAC  Level of Consciousness: sedated  Airway & Oxygen Therapy: Patient Spontanous Breathing and Patient connected to nasal cannula oxygen  Post-op Assessment: Report given to RN and Post -op Vital signs reviewed and stable  Post vital signs: Reviewed and stable  Last Vitals:  Vitals Value Taken Time  BP    Temp    Pulse 66 12/13/20 1104  Resp 22 12/13/20 1104  SpO2 97 % 12/13/20 1104  Vitals shown include unvalidated device data.  Last Pain:  Vitals:   12/13/20 0830  TempSrc: Oral         Complications: No notable events documented.

## 2020-12-14 LAB — SURGICAL PATHOLOGY

## 2020-12-27 DIAGNOSIS — R519 Headache, unspecified: Secondary | ICD-10-CM | POA: Diagnosis not present

## 2020-12-27 DIAGNOSIS — R42 Dizziness and giddiness: Secondary | ICD-10-CM | POA: Diagnosis not present

## 2020-12-27 DIAGNOSIS — H9313 Tinnitus, bilateral: Secondary | ICD-10-CM | POA: Diagnosis not present

## 2020-12-27 DIAGNOSIS — R251 Tremor, unspecified: Secondary | ICD-10-CM | POA: Diagnosis not present

## 2020-12-27 DIAGNOSIS — H43393 Other vitreous opacities, bilateral: Secondary | ICD-10-CM | POA: Diagnosis not present

## 2020-12-27 DIAGNOSIS — R413 Other amnesia: Secondary | ICD-10-CM | POA: Diagnosis not present

## 2020-12-27 DIAGNOSIS — Z8673 Personal history of transient ischemic attack (TIA), and cerebral infarction without residual deficits: Secondary | ICD-10-CM | POA: Diagnosis not present

## 2021-01-01 DIAGNOSIS — J449 Chronic obstructive pulmonary disease, unspecified: Secondary | ICD-10-CM | POA: Diagnosis not present

## 2021-01-04 DIAGNOSIS — M341 CR(E)ST syndrome: Secondary | ICD-10-CM | POA: Diagnosis not present

## 2021-01-04 DIAGNOSIS — I7300 Raynaud's syndrome without gangrene: Secondary | ICD-10-CM | POA: Diagnosis not present

## 2021-01-04 DIAGNOSIS — Z79899 Other long term (current) drug therapy: Secondary | ICD-10-CM | POA: Diagnosis not present

## 2021-01-26 DIAGNOSIS — I712 Thoracic aortic aneurysm, without rupture: Secondary | ICD-10-CM | POA: Diagnosis not present

## 2021-01-26 DIAGNOSIS — I495 Sick sinus syndrome: Secondary | ICD-10-CM | POA: Diagnosis not present

## 2021-01-26 DIAGNOSIS — R600 Localized edema: Secondary | ICD-10-CM | POA: Diagnosis not present

## 2021-01-26 DIAGNOSIS — Z8673 Personal history of transient ischemic attack (TIA), and cerebral infarction without residual deficits: Secondary | ICD-10-CM | POA: Diagnosis not present

## 2021-01-26 DIAGNOSIS — J449 Chronic obstructive pulmonary disease, unspecified: Secondary | ICD-10-CM | POA: Diagnosis not present

## 2021-01-26 DIAGNOSIS — I6523 Occlusion and stenosis of bilateral carotid arteries: Secondary | ICD-10-CM | POA: Diagnosis not present

## 2021-01-26 DIAGNOSIS — I7300 Raynaud's syndrome without gangrene: Secondary | ICD-10-CM | POA: Diagnosis not present

## 2021-02-01 DIAGNOSIS — J449 Chronic obstructive pulmonary disease, unspecified: Secondary | ICD-10-CM | POA: Diagnosis not present

## 2021-02-09 ENCOUNTER — Encounter: Payer: Self-pay | Admitting: General Surgery
